# Patient Record
Sex: Male | Born: 1955 | Race: White | Hispanic: No | Marital: Married | Smoking: Current every day smoker
Health system: Southern US, Community
[De-identification: ages and names within clinical notes are randomized; demographics above are authoritative.]

## PROBLEM LIST (undated history)

## (undated) DIAGNOSIS — F329 Major depressive disorder, single episode, unspecified: Secondary | ICD-10-CM

## (undated) DIAGNOSIS — N400 Enlarged prostate without lower urinary tract symptoms: Secondary | ICD-10-CM

## (undated) DIAGNOSIS — I10 Essential (primary) hypertension: Secondary | ICD-10-CM

## (undated) DIAGNOSIS — Z87442 Personal history of urinary calculi: Secondary | ICD-10-CM

## (undated) DIAGNOSIS — I639 Cerebral infarction, unspecified: Secondary | ICD-10-CM

## (undated) DIAGNOSIS — F32A Depression, unspecified: Secondary | ICD-10-CM

## (undated) DIAGNOSIS — R7302 Impaired glucose tolerance (oral): Secondary | ICD-10-CM

## (undated) DIAGNOSIS — E785 Hyperlipidemia, unspecified: Secondary | ICD-10-CM

## (undated) DIAGNOSIS — R0602 Shortness of breath: Secondary | ICD-10-CM

## (undated) DIAGNOSIS — F419 Anxiety disorder, unspecified: Secondary | ICD-10-CM

## (undated) DIAGNOSIS — C61 Malignant neoplasm of prostate: Secondary | ICD-10-CM

## (undated) HISTORY — DX: Essential (primary) hypertension: I10

## (undated) HISTORY — DX: Major depressive disorder, single episode, unspecified: F32.9

## (undated) HISTORY — DX: Impaired glucose tolerance (oral): R73.02

## (undated) HISTORY — DX: Personal history of urinary calculi: Z87.442

## (undated) HISTORY — DX: Malignant neoplasm of prostate: C61

## (undated) HISTORY — DX: Depression, unspecified: F32.A

## (undated) HISTORY — DX: Benign prostatic hyperplasia without lower urinary tract symptoms: N40.0

## (undated) HISTORY — DX: Anxiety disorder, unspecified: F41.9

## (undated) HISTORY — DX: Cerebral infarction, unspecified: I63.9

## (undated) HISTORY — DX: Hyperlipidemia, unspecified: E78.5

---

## 1990-08-01 DIAGNOSIS — I10 Essential (primary) hypertension: Secondary | ICD-10-CM

## 1990-08-01 HISTORY — DX: Essential (primary) hypertension: I10

## 1999-02-06 ENCOUNTER — Encounter: Payer: Self-pay | Admitting: *Deleted

## 1999-02-06 ENCOUNTER — Emergency Department (HOSPITAL_COMMUNITY): Admission: EM | Admit: 1999-02-06 | Discharge: 1999-02-06 | Payer: Self-pay | Admitting: *Deleted

## 2002-09-30 HISTORY — PX: OTHER SURGICAL HISTORY: SHX169

## 2004-01-30 HISTORY — PX: LUNG BIOPSY: SHX232

## 2004-02-08 ENCOUNTER — Inpatient Hospital Stay (HOSPITAL_COMMUNITY): Admission: EM | Admit: 2004-02-08 | Discharge: 2004-02-09 | Payer: Self-pay | Admitting: Emergency Medicine

## 2004-02-09 ENCOUNTER — Encounter (INDEPENDENT_AMBULATORY_CARE_PROVIDER_SITE_OTHER): Payer: Self-pay | Admitting: Specialist

## 2004-02-19 ENCOUNTER — Encounter: Payer: Self-pay | Admitting: Internal Medicine

## 2004-02-23 ENCOUNTER — Ambulatory Visit (HOSPITAL_COMMUNITY): Admission: RE | Admit: 2004-02-23 | Discharge: 2004-02-23 | Payer: Self-pay | Admitting: Thoracic Surgery

## 2004-02-27 ENCOUNTER — Encounter (INDEPENDENT_AMBULATORY_CARE_PROVIDER_SITE_OTHER): Payer: Self-pay | Admitting: Specialist

## 2004-02-27 ENCOUNTER — Ambulatory Visit (HOSPITAL_COMMUNITY): Admission: RE | Admit: 2004-02-27 | Discharge: 2004-02-27 | Payer: Self-pay | Admitting: Thoracic Surgery

## 2004-03-09 ENCOUNTER — Encounter: Admission: RE | Admit: 2004-03-09 | Discharge: 2004-03-09 | Payer: Self-pay | Admitting: Thoracic Surgery

## 2004-09-05 IMAGING — CR DG CHEST 2V
2 series · 2 of 2 positions shown · non-contrast
Comparison: none

CLINICAL DATA: Lung lesion.  Prior biopsy.  Follow up. 
 CHEST X-RAY: 
 Two views of the chest are compared to a chest x-ray of 02/24/04 as well as a CT of the chest of 02/08/04.  The lesion noted in the right suprahilar region on CT of the chest is not well seen on today?s chest x-ray or the recent chest x-ray.  There may be slightly less opacity in the right suprahilar region on the frontal projection than on the chest x-ray of 02/24/04.  No discrete nodule is seen and no effusion is noted.  The heart is within normal limits in size.  There are degenerative changes in the shoulders.

[view not recorded (1 of 2)]
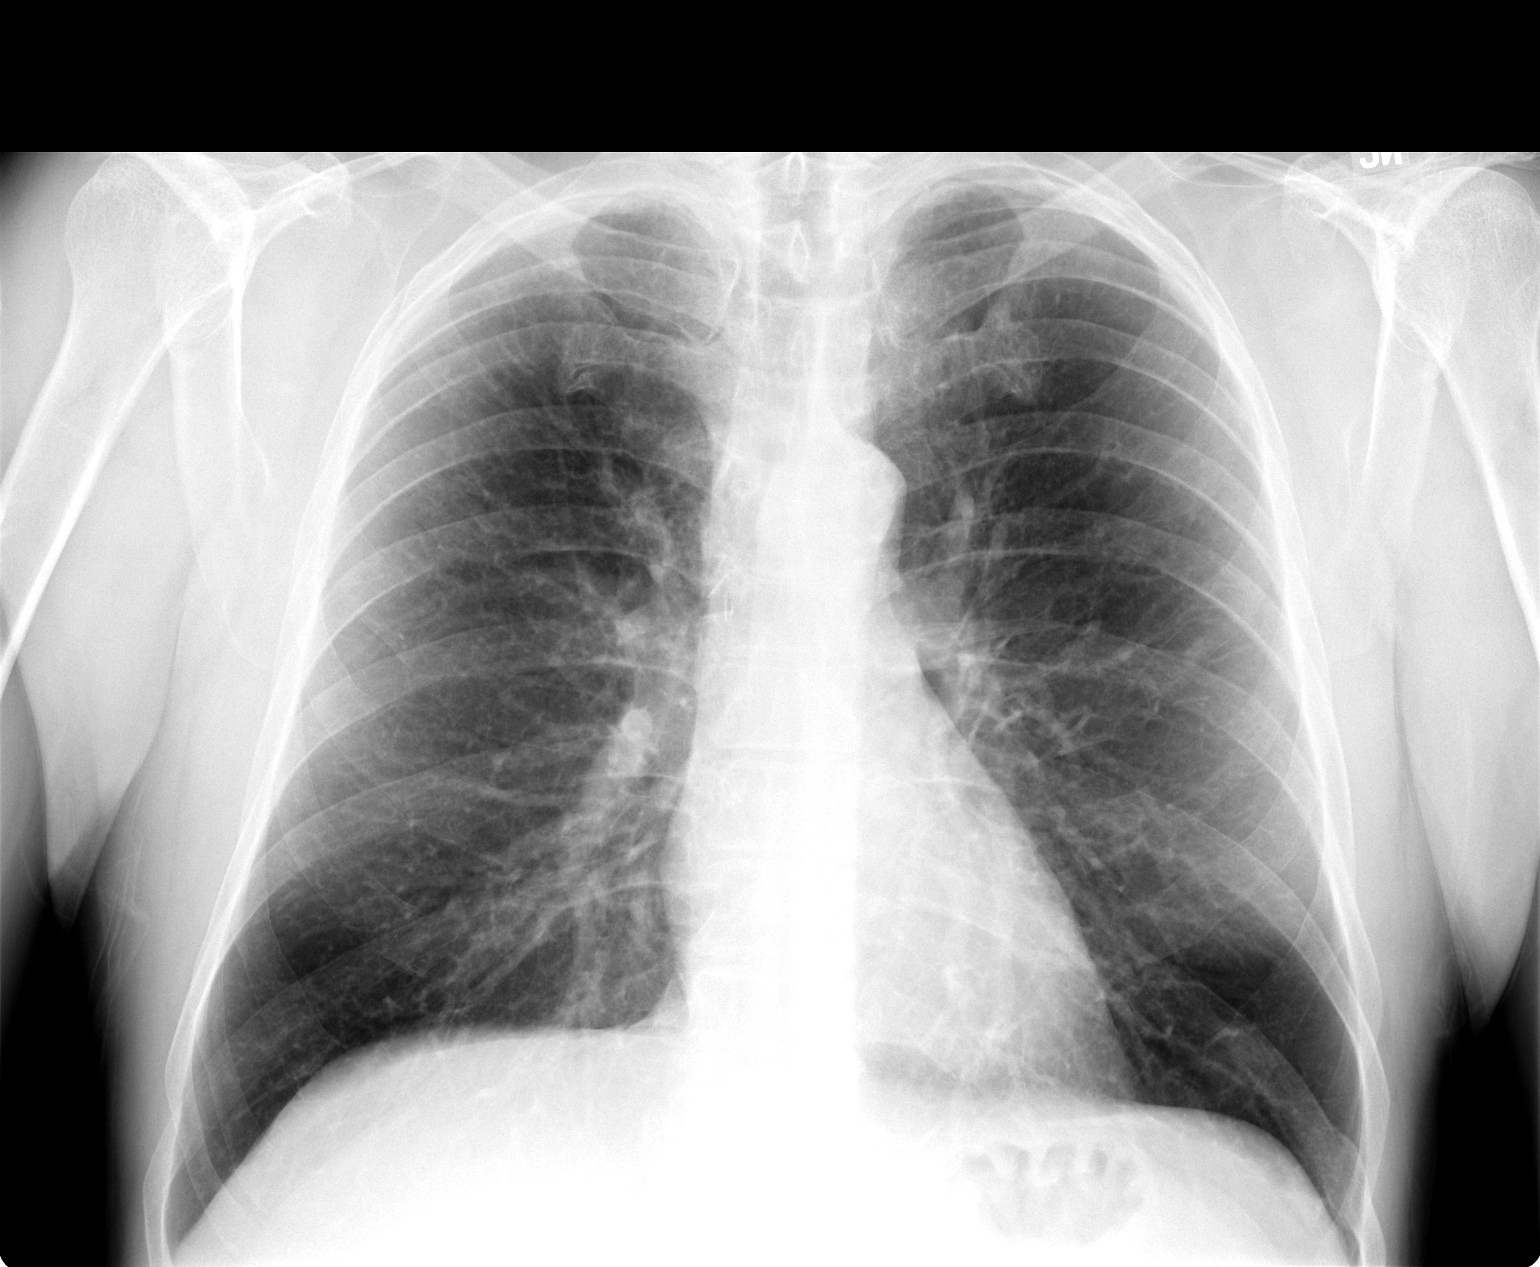

[view not recorded (2 of 2)]
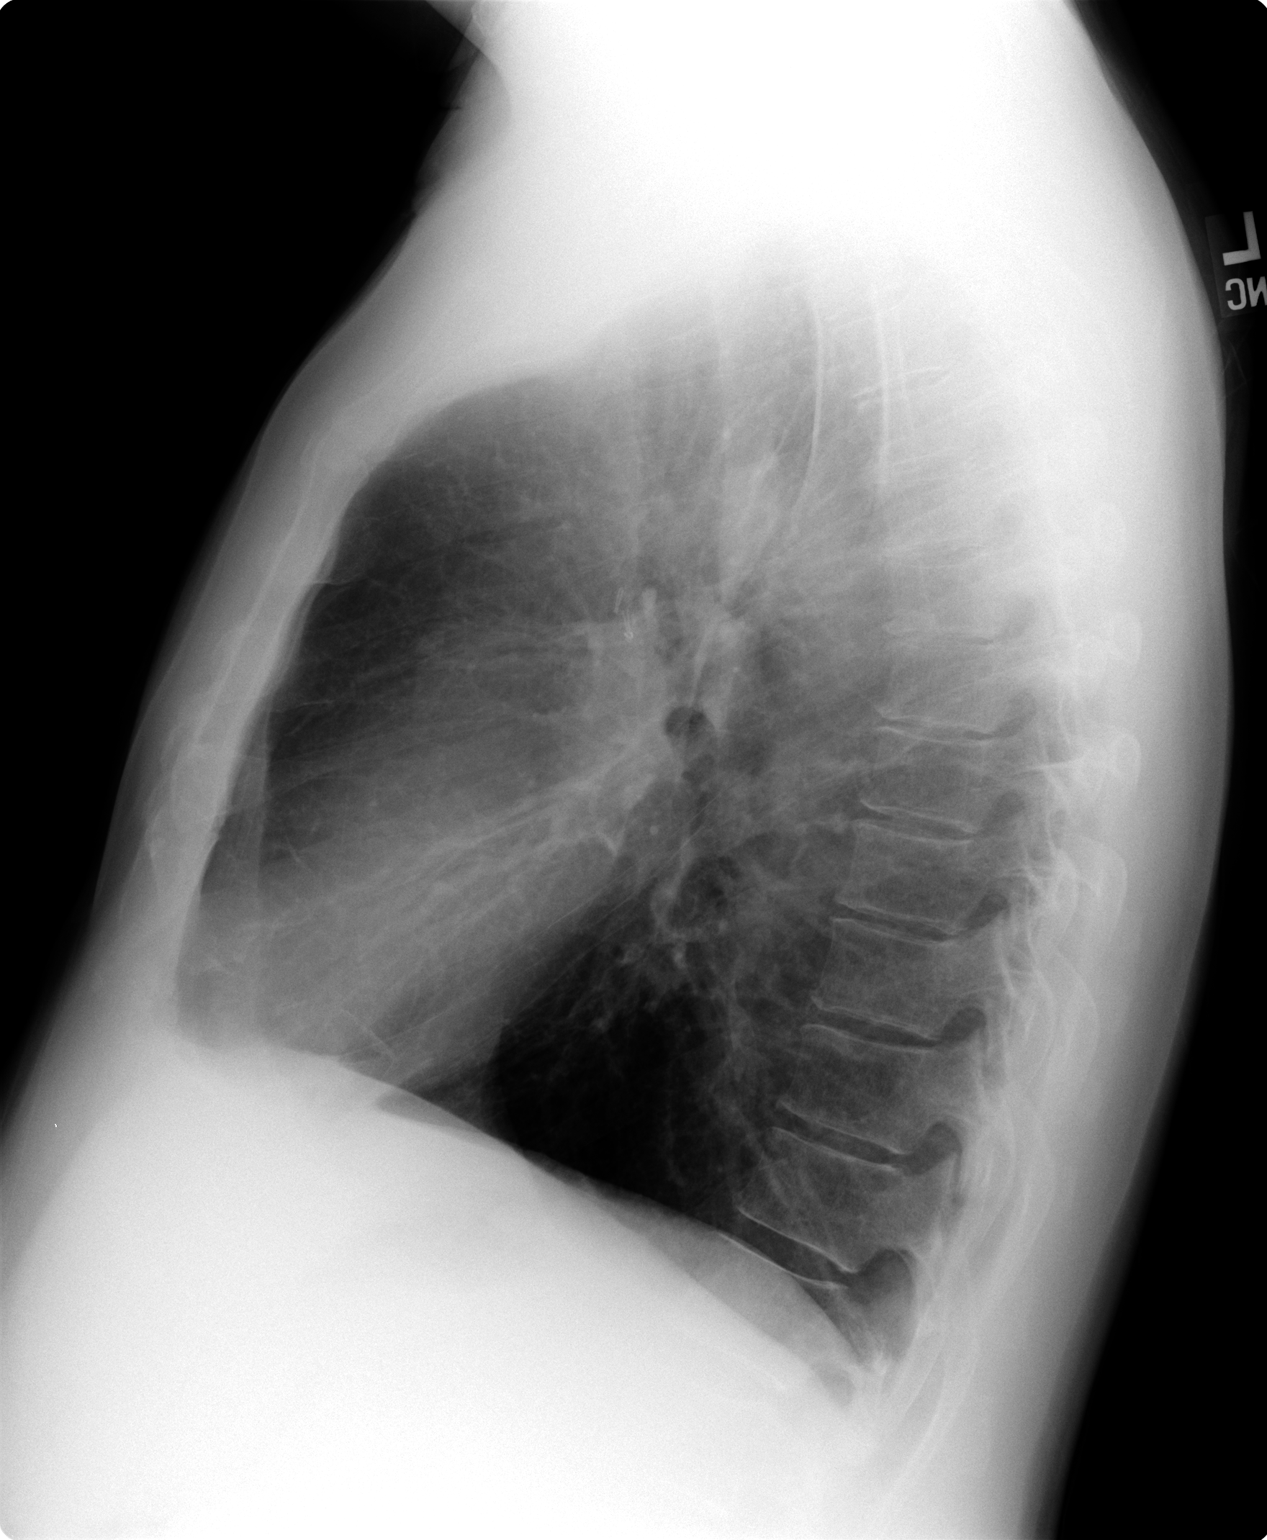

[2 of 2 positions shown; findings below may reference images not displayed]

IMPRESSION: There is perhaps slightly less opacity in the right suprahilar region.  No suspicious mass is noted and no effusion is noted.

## 2004-11-05 ENCOUNTER — Ambulatory Visit: Payer: Self-pay | Admitting: Internal Medicine

## 2004-11-18 ENCOUNTER — Ambulatory Visit: Payer: Self-pay | Admitting: Family Medicine

## 2004-12-03 ENCOUNTER — Encounter: Payer: Self-pay | Admitting: Internal Medicine

## 2005-07-19 ENCOUNTER — Ambulatory Visit: Payer: Self-pay | Admitting: Internal Medicine

## 2005-08-04 ENCOUNTER — Ambulatory Visit: Payer: Self-pay | Admitting: Internal Medicine

## 2005-08-09 ENCOUNTER — Ambulatory Visit: Payer: Self-pay | Admitting: Cardiology

## 2005-09-29 HISTORY — PX: RECTAL EXAMINATION UNDER ANESTHESIA W/ CYSTOSCOPY: SHX2307

## 2005-10-30 DIAGNOSIS — C61 Malignant neoplasm of prostate: Secondary | ICD-10-CM

## 2005-10-30 HISTORY — DX: Malignant neoplasm of prostate: C61

## 2005-11-15 ENCOUNTER — Ambulatory Visit: Payer: Self-pay | Admitting: Urology

## 2005-11-23 ENCOUNTER — Other Ambulatory Visit: Payer: Self-pay

## 2005-11-23 ENCOUNTER — Ambulatory Visit: Payer: Self-pay | Admitting: Urology

## 2005-12-14 ENCOUNTER — Ambulatory Visit: Payer: Self-pay | Admitting: Internal Medicine

## 2005-12-30 HISTORY — PX: OTHER SURGICAL HISTORY: SHX169

## 2006-06-20 ENCOUNTER — Ambulatory Visit: Payer: Self-pay | Admitting: Internal Medicine

## 2006-09-22 ENCOUNTER — Ambulatory Visit: Payer: Self-pay | Admitting: Internal Medicine

## 2006-09-22 LAB — CONVERTED CEMR LAB
Cholesterol: 192 mg/dL (ref 0–200)
Hgb A1c MFr Bld: 5.9 % (ref 4.6–6.0)
Microalb, Ur: 0.4 mg/dL (ref 0.0–1.9)
Triglycerides: 41 mg/dL (ref 0–149)

## 2006-12-18 ENCOUNTER — Telehealth (INDEPENDENT_AMBULATORY_CARE_PROVIDER_SITE_OTHER): Payer: Self-pay | Admitting: *Deleted

## 2007-01-12 DIAGNOSIS — N400 Enlarged prostate without lower urinary tract symptoms: Secondary | ICD-10-CM | POA: Insufficient documentation

## 2007-01-12 DIAGNOSIS — I10 Essential (primary) hypertension: Secondary | ICD-10-CM | POA: Insufficient documentation

## 2007-01-12 DIAGNOSIS — E785 Hyperlipidemia, unspecified: Secondary | ICD-10-CM

## 2007-02-22 ENCOUNTER — Encounter (INDEPENDENT_AMBULATORY_CARE_PROVIDER_SITE_OTHER): Payer: Self-pay | Admitting: *Deleted

## 2007-03-02 ENCOUNTER — Ambulatory Visit: Payer: Self-pay | Admitting: Internal Medicine

## 2007-03-12 LAB — CONVERTED CEMR LAB
Albumin: 4.1 g/dL (ref 3.5–5.2)
BUN: 7 mg/dL (ref 6–23)
Chloride: 100 meq/L (ref 96–112)
Potassium: 4.3 meq/L (ref 3.5–5.1)
Sodium: 138 meq/L (ref 135–145)

## 2007-03-14 ENCOUNTER — Encounter: Payer: Self-pay | Admitting: Internal Medicine

## 2007-06-12 ENCOUNTER — Telehealth: Payer: Self-pay | Admitting: Internal Medicine

## 2007-08-01 ENCOUNTER — Telehealth: Payer: Self-pay | Admitting: Family Medicine

## 2007-09-05 ENCOUNTER — Telehealth (INDEPENDENT_AMBULATORY_CARE_PROVIDER_SITE_OTHER): Payer: Self-pay | Admitting: *Deleted

## 2007-09-12 ENCOUNTER — Ambulatory Visit: Payer: Self-pay | Admitting: Internal Medicine

## 2007-09-12 DIAGNOSIS — F411 Generalized anxiety disorder: Secondary | ICD-10-CM | POA: Insufficient documentation

## 2007-09-13 LAB — CONVERTED CEMR LAB
Albumin: 3.9 g/dL (ref 3.5–5.2)
BUN: 6 mg/dL (ref 6–23)
CO2: 30 meq/L (ref 19–32)
Chloride: 101 meq/L (ref 96–112)
Cholesterol: 197 mg/dL (ref 0–200)
HDL: 102.8 mg/dL (ref >39.0)
Potassium: 4 meq/L (ref 3.5–5.1)
Sodium: 137 meq/L (ref 135–145)
Total CHOL/HDL Ratio: 1.9

## 2007-09-19 ENCOUNTER — Encounter: Payer: Self-pay | Admitting: Internal Medicine

## 2007-09-19 ENCOUNTER — Ambulatory Visit: Payer: Self-pay | Admitting: Urology

## 2007-12-17 ENCOUNTER — Encounter: Payer: Self-pay | Admitting: Internal Medicine

## 2007-12-19 ENCOUNTER — Telehealth (INDEPENDENT_AMBULATORY_CARE_PROVIDER_SITE_OTHER): Payer: Self-pay | Admitting: *Deleted

## 2008-03-11 ENCOUNTER — Ambulatory Visit: Payer: Self-pay | Admitting: Internal Medicine

## 2008-03-11 DIAGNOSIS — F341 Dysthymic disorder: Secondary | ICD-10-CM

## 2008-03-12 LAB — CONVERTED CEMR LAB
ALT: 46 units/L (ref 0–53)
AST: 36 units/L (ref 0–37)
Albumin: 4 g/dL (ref 3.5–5.2)
Alkaline Phosphatase: 48 units/L (ref 39–117)
Bilirubin, Direct: 0.1 mg/dL (ref 0.0–0.3)
CO2: 30 meq/L (ref 19–32)
Eosinophils Relative: 1.8 % (ref 0.0–5.0)
Glucose, Bld: 115 mg/dL — ABNORMAL HIGH (ref 70–99)
HDL: 88 mg/dL (ref >39.0)
Hemoglobin: 17.8 g/dL — ABNORMAL HIGH (ref 13.0–17.0)
LDL Cholesterol: 88 mg/dL (ref 0–99)
MCV: 94.7 fL (ref 78.0–100.0)
Neutro Abs: 3.2 10*3/uL (ref 1.4–7.7)
Phosphorus: 4.4 mg/dL (ref 2.3–4.6)
Potassium: 4.4 meq/L (ref 3.5–5.1)
RDW: 13.7 % (ref 11.5–14.6)
TSH: 2.16 microintl units/mL (ref 0.35–5.50)
Total Bilirubin: 0.8 mg/dL (ref 0.3–1.2)
Total Protein: 6.8 g/dL (ref 6.0–8.3)
Triglycerides: 40 mg/dL (ref 0–149)
WBC: 4.7 10*3/uL (ref 4.5–10.5)

## 2008-04-01 ENCOUNTER — Telehealth: Payer: Self-pay | Admitting: Internal Medicine

## 2008-04-22 ENCOUNTER — Ambulatory Visit: Payer: Self-pay | Admitting: Internal Medicine

## 2008-05-20 ENCOUNTER — Telehealth: Payer: Self-pay | Admitting: Internal Medicine

## 2008-07-08 ENCOUNTER — Encounter: Payer: Self-pay | Admitting: Internal Medicine

## 2008-08-25 ENCOUNTER — Telehealth: Payer: Self-pay | Admitting: Internal Medicine

## 2008-09-01 HISTORY — PX: OTHER SURGICAL HISTORY: SHX169

## 2008-09-16 ENCOUNTER — Telehealth: Payer: Self-pay | Admitting: Internal Medicine

## 2008-09-17 ENCOUNTER — Ambulatory Visit: Payer: Self-pay | Admitting: Internal Medicine

## 2008-09-17 DIAGNOSIS — R109 Unspecified abdominal pain: Secondary | ICD-10-CM

## 2008-09-18 ENCOUNTER — Encounter: Payer: Self-pay | Admitting: Internal Medicine

## 2008-09-26 ENCOUNTER — Telehealth: Payer: Self-pay | Admitting: Internal Medicine

## 2008-10-17 ENCOUNTER — Ambulatory Visit: Payer: Self-pay | Admitting: Internal Medicine

## 2008-10-17 DIAGNOSIS — R7301 Impaired fasting glucose: Secondary | ICD-10-CM

## 2008-10-20 ENCOUNTER — Encounter: Payer: Self-pay | Admitting: Internal Medicine

## 2008-10-20 LAB — CONVERTED CEMR LAB
AST: 19 units/L (ref 0–37)
Albumin: 4.1 g/dL (ref 3.5–5.2)
Alkaline Phosphatase: 49 units/L (ref 39–117)
Basophils Relative: 0.1 % (ref 0.0–3.0)
Bilirubin, Direct: 0 mg/dL (ref 0.0–0.3)
Cholesterol: 190 mg/dL (ref 0–200)
Eosinophils Relative: 1.6 % (ref 0.0–5.0)
HCT: 52 % (ref 39.0–52.0)
HDL: 91.6 mg/dL (ref >39.00)
LDL Cholesterol: 88 mg/dL (ref 0–99)
Lymphs Abs: 0.9 10*3/uL (ref 0.7–4.0)
Neutro Abs: 3.1 10*3/uL (ref 1.4–7.7)
Neutrophils Relative %: 69.5 % (ref 43.0–77.0)
Platelets: 178 10*3/uL (ref 150.0–400.0)
Potassium: 3.8 meq/L (ref 3.5–5.1)
RDW: 13.4 % (ref 11.5–14.6)
Sodium: 140 meq/L (ref 135–145)
Total Bilirubin: 0.9 mg/dL (ref 0.3–1.2)
Total Protein: 6.8 g/dL (ref 6.0–8.3)
VLDL: 10.8 mg/dL (ref 0.0–40.0)

## 2009-02-12 ENCOUNTER — Telehealth: Payer: Self-pay | Admitting: Internal Medicine

## 2009-04-10 ENCOUNTER — Telehealth: Payer: Self-pay | Admitting: Internal Medicine

## 2009-04-27 ENCOUNTER — Encounter: Payer: Self-pay | Admitting: Internal Medicine

## 2009-06-18 ENCOUNTER — Telehealth: Payer: Self-pay | Admitting: Internal Medicine

## 2009-07-01 DIAGNOSIS — I639 Cerebral infarction, unspecified: Secondary | ICD-10-CM

## 2009-07-01 HISTORY — DX: Cerebral infarction, unspecified: I63.9

## 2009-07-26 DIAGNOSIS — I69959 Hemiplegia and hemiparesis following unspecified cerebrovascular disease affecting unspecified side: Secondary | ICD-10-CM | POA: Insufficient documentation

## 2009-07-28 ENCOUNTER — Ambulatory Visit: Payer: Self-pay | Admitting: Family Medicine

## 2009-08-04 ENCOUNTER — Ambulatory Visit (HOSPITAL_COMMUNITY): Admission: RE | Admit: 2009-08-04 | Discharge: 2009-08-04 | Payer: Self-pay | Admitting: Diagnostic Neuroimaging

## 2009-08-04 ENCOUNTER — Encounter (INDEPENDENT_AMBULATORY_CARE_PROVIDER_SITE_OTHER): Payer: Self-pay | Admitting: Diagnostic Neuroimaging

## 2009-08-04 ENCOUNTER — Ambulatory Visit: Payer: Self-pay

## 2009-08-04 ENCOUNTER — Ambulatory Visit: Payer: Self-pay | Admitting: Cardiovascular Disease

## 2009-08-05 ENCOUNTER — Encounter: Payer: Self-pay | Admitting: Internal Medicine

## 2009-08-10 ENCOUNTER — Telehealth (INDEPENDENT_AMBULATORY_CARE_PROVIDER_SITE_OTHER): Payer: Self-pay | Admitting: *Deleted

## 2009-08-11 ENCOUNTER — Ambulatory Visit: Payer: Self-pay | Admitting: Internal Medicine

## 2009-08-14 ENCOUNTER — Telehealth: Payer: Self-pay | Admitting: Internal Medicine

## 2009-08-15 ENCOUNTER — Emergency Department (HOSPITAL_COMMUNITY): Admission: EM | Admit: 2009-08-15 | Discharge: 2009-08-15 | Payer: Self-pay | Admitting: Emergency Medicine

## 2009-08-17 ENCOUNTER — Ambulatory Visit: Payer: Self-pay | Admitting: Internal Medicine

## 2009-09-01 ENCOUNTER — Ambulatory Visit: Payer: Self-pay | Admitting: Internal Medicine

## 2009-09-03 LAB — CONVERTED CEMR LAB
Albumin: 4.3 g/dL (ref 3.5–5.2)
Basophils Absolute: 0 10*3/uL (ref 0.0–0.1)
Basophils Relative: 0.7 % (ref 0.0–3.0)
Bilirubin, Direct: 0 mg/dL (ref 0.0–0.3)
Calcium: 9.2 mg/dL (ref 8.4–10.5)
Chloride: 102 meq/L (ref 96–112)
GFR calc non Af Amer: 82.23 mL/min (ref >60)
HCT: 53.3 % — ABNORMAL HIGH (ref 39.0–52.0)
Hemoglobin: 17.9 g/dL — ABNORMAL HIGH (ref 13.0–17.0)
LDL Cholesterol: 55 mg/dL (ref 0–99)
Lymphocytes Relative: 21 % (ref 12.0–46.0)
Lymphs Abs: 1 10*3/uL (ref 0.7–4.0)
MCHC: 33.6 g/dL (ref 30.0–36.0)
Monocytes Absolute: 0.4 10*3/uL (ref 0.1–1.0)
Monocytes Relative: 7.4 % (ref 3.0–12.0)
Neutrophils Relative %: 69.7 % (ref 43.0–77.0)
Platelets: 194 10*3/uL (ref 150.0–400.0)
Potassium: 4 meq/L (ref 3.5–5.1)
RBC: 5.37 M/uL (ref 4.22–5.81)
RDW: 12.6 % (ref 11.5–14.6)
Sodium: 140 meq/L (ref 135–145)
TSH: 2.02 microintl units/mL (ref 0.35–5.50)
Total Bilirubin: 0.3 mg/dL (ref 0.3–1.2)
Triglycerides: 52 mg/dL (ref 0.0–149.0)
WBC: 5 10*3/uL (ref 4.5–10.5)

## 2009-10-13 ENCOUNTER — Ambulatory Visit: Payer: Self-pay | Admitting: Internal Medicine

## 2009-11-11 ENCOUNTER — Encounter: Payer: Self-pay | Admitting: Internal Medicine

## 2009-11-23 ENCOUNTER — Telehealth: Payer: Self-pay | Admitting: Internal Medicine

## 2009-12-01 ENCOUNTER — Ambulatory Visit: Payer: Self-pay | Admitting: Internal Medicine

## 2010-02-04 ENCOUNTER — Ambulatory Visit: Payer: Self-pay | Admitting: Internal Medicine

## 2010-04-07 ENCOUNTER — Ambulatory Visit: Payer: Self-pay | Admitting: Internal Medicine

## 2010-04-07 DIAGNOSIS — F329 Major depressive disorder, single episode, unspecified: Secondary | ICD-10-CM

## 2010-04-20 ENCOUNTER — Telehealth: Payer: Self-pay | Admitting: Internal Medicine

## 2010-04-20 ENCOUNTER — Ambulatory Visit: Payer: Self-pay | Admitting: Psychology

## 2010-04-27 ENCOUNTER — Ambulatory Visit: Payer: Self-pay | Admitting: Internal Medicine

## 2010-04-28 ENCOUNTER — Encounter: Payer: Self-pay | Admitting: Internal Medicine

## 2010-06-02 ENCOUNTER — Encounter: Payer: Self-pay | Admitting: Internal Medicine

## 2010-06-23 ENCOUNTER — Encounter: Payer: Self-pay | Admitting: Internal Medicine

## 2010-07-01 ENCOUNTER — Encounter: Payer: Self-pay | Admitting: Internal Medicine

## 2010-07-05 ENCOUNTER — Ambulatory Visit: Payer: Self-pay | Admitting: Urology

## 2010-07-07 ENCOUNTER — Ambulatory Visit: Payer: Self-pay | Admitting: Urology

## 2010-07-12 ENCOUNTER — Ambulatory Visit: Payer: Self-pay | Admitting: Internal Medicine

## 2010-07-12 DIAGNOSIS — L570 Actinic keratosis: Secondary | ICD-10-CM

## 2010-07-13 ENCOUNTER — Encounter: Payer: Self-pay | Admitting: Internal Medicine

## 2010-08-21 ENCOUNTER — Encounter: Payer: Self-pay | Admitting: Thoracic Surgery

## 2010-08-31 NOTE — Letter (Signed)
Summary: [facility]   Imported By: Lanelle Bal 07/02/2010 13:59:57  _____________________________________________________________________  External Attachment:    Type:   Image     Comment:   External Document  Appended Document: [facility] prostate biopsy done for increasing PSA

## 2010-08-31 NOTE — Assessment & Plan Note (Signed)
Summary: 1 MONTH FOLLOW-UP/DS   Vital Signs:  Patient profile:   55 year old male Weight:      153 pounds Temp:     98.5 degrees F oral Pulse rate:   80 / minute Pulse rhythm:   regular BP sitting:   128 / 70  (left arm) Cuff size:   large  Vitals Entered By: Mervin Hack CMA Duncan Dull) (September 01, 2009 11:08 AM) CC: follow-up visit   History of Present Illness: Has had a quiet month Saw neurologist this AM Not sure he had a second stroke--may have just been an accentuation of first Only mild disability No PT/OT seems to be needed Did have carotid narrowing on side opposite the stroke--no immediate attention needed  Has cut back on cigarettes about 1/2 of previous dose never tried patch Hospital doctor nicotine  Still slight numbness in V2 distribution on right (face) right hand feels slightly tingly as well Mild weakness in right hand and foot--coordination problems  Wife notes depression Not checking mail or answering machine at times Citalopram has seemed to help nerves  Allergies: 1)  Erythromycin Ethylsuccinate 2)  Viagra (Sildenafil Citrate)  Past History:  Past medical, surgical, family and social histories (including risk factors) reviewed for relevance to current acute and chronic problems.  Past Medical History: Reviewed history from 08/11/2009 and no changes required. Prostate cancer   10/2005---------Dr Cope  [phone] Impaired glucose tolerance Hyperlipidemia Hypertension  1992 Benign prostatic hypertrophy Kidney stone Anxiety CVA--12/10  Past Surgical History: Reviewed history from 09/17/2008 and no changes required. 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 Dad with CAD. Died of brain tumor @77  Mom with HTN,early DM, dementia Only child No cancer in  family  Social History: Reviewed history from 01/12/2007 and no changes required. 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   Neck:  supple, no masses, no thyromegaly, no carotid bruits, and no cervical lymphadenopathy.   Lungs:  normal respiratory effort and normal breath sounds.   Heart:  normal rate, regular rhythm, no murmur, and no gallop.   Extremities:  no edema Neurologic:  alert & oriented X3, strength normal in all extremities, and gait normal.   Psych:  normally interactive, good eye contact, dysphoric affect, and slightly anxious.     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 Take one by mouth two times a day His updated medication list for this problem includes:    Plavix 75 Mg Tabs (Clopidogrel bisulfate) .Marland Kitchen... 1 daily for stroke prevention    Aspirin Ec 325 Mg Tbec (Aspirin) .Marland Kitchen... 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 Take 1 tablet by mouth once a day    Losartan Potassium 100 Mg Tabs (Losartan potassium) .Marland Kitchen... 1 daily for high blood pressure  BP today: 128/70 Prior BP: 170/90  (08/17/2009)  Labs Reviewed: K+: 3.8 (10/17/2008) Creat: : 0.9 (10/17/2008)   Chol: 190 (10/17/2008)   HDL: 91.60 (10/17/2008)   LDL: 88 (10/17/2008)   TG: 54.0 (10/17/2008)  Orders: TLB-Renal Function Panel (80069-RENAL) TLB-CBC Platelet - w/Differential (85025-CBCD) TLB-TSH (Thyroid Stimulating Hormone) (84443-TSH)  Problem # 4:  HYPERLIPIDEMIA (ICD-272.4) Assessment: Unchanged  due for labs  His updated medication list for this problem includes:    Simvastatin 10 Mg Tabs (Simvastatin) .Marland Kitchen... 1 tab daily to lower cholesterol  Labs Reviewed: SGOT: 19 (10/17/2008)   SGPT: 22 (10/17/2008)   HDL:91.60 (10/17/2008), 88.0 (03/11/2008)  LDL:88 (10/17/2008), 88 (03/11/2008)  Chol:190 (10/17/2008), 184 (03/11/2008)  Trig:54.0 (10/17/2008), 40 (03/11/2008)  Orders: TLB-Lipid Panel (80061-LIPID) TLB-Hepatic/Liver Function Pnl (80076-HEPATIC) Venipuncture (04540)  Complete Medication List: 1)  Dilt-xr 180 Mg Cp24 (Diltiazem hcl) .... Take 1 tablet by mouth once a day 2)  Diazepam 5 Mg Tabs (Diazepam) .... Take 1 tablet by mouth twice a day 3)  Viagra 100 Mg Tabs (Sildenafil citrate) .Marland Kitchen.. 1 before sex as directed 4)  Flomax 0.4 Mg Xr24h-cap (Tamsulosin hcl) .Marland Kitchen.. 1 daily 5)  Potassium Citrate 10 Meq (1080 Mg) Cr-tabs (Potassium citrate) .... Take 1 by mouth once daily 6)  Simvastatin 10 Mg Tabs (Simvastatin) .Marland Kitchen.. 1 tab daily to lower cholesterol 7)  Losartan Potassium 100 Mg Tabs (Losartan potassium) .Marland Kitchen.. 1 daily for high blood pressure 8)  Citalopram Hydrobromide 20 Mg Tabs (Citalopram hydrobromide) .Marland Kitchen.. 1 tab daily for anxiety and mood 9)  Plavix 75 Mg Tabs (Clopidogrel bisulfate) .Marland Kitchen.. 1 daily for stroke prevention 10)  Aspirin Ec 325 Mg Tbec (Aspirin) .Marland Kitchen.. 1 daily  Patient Instructions: 1)  [facility] from [facility] 2)  Please schedule a follow-up appointment in 6-8  weeks.  3)  Please start nicotine patch, 21mg  daily, and stop smoking!!!! Prescriptions: PLAVIX 75 MG TABS  (CLOPIDOGREL BISULFATE) 1 daily for stroke prevention  #30 x 12   Entered and Authorized by:   Cindee Salt MD   Signed by:   Cindee Salt MD on 09/01/2009   Method used:   [facility] to        [facility] [phone]* (retail)       [address] [place], [place]  [address]       Ph: [phone] or [phone]       Fax: [phone]   RxID:   [id number] CITALOPRAM HYDROBROMIDE 20 MG TABS (CITALOPRAM HYDROBROMIDE) 1 tab daily for anxiety and mood  #30 x 12   Entered and Authorized by:   Cindee Salt MD   Signed by:   Cindee Salt MD on 09/01/2009   Method used:   Electronically to        [facility] [phone]* (retail)       [address] [place], [place]  [address]       Ph: [phone] or [phone]       Fax: [phone]   RxID:  [id number]   Current Allergies (reviewed today): ERYTHROMYCIN ETHYLSUCCINATE VIAGRA (SILDENAFIL CITRATE)

## 2010-08-31 NOTE — Assessment & Plan Note (Signed)
Summary: DISCUSS MEDICATION/CLE   Vital Signs:  Patient profile:   55 year old male Weight:      176 pounds O2 Sat:      95 % on Room air Temp:     98.3 degrees F oral Pulse rate:   82 / minute Pulse rhythm:   regular BP sitting:   122 / 70  (left arm) Cuff size:   large  Vitals Entered By: Mervin Hack CMA Duncan Dull) (April 07, 2010 12:40 PM)  O2 Flow:  Room air CC: discuss medications   History of Present Illness: DOing "terrible" wife unable to come today but she is concerned  He is very depressed wife feels "he needs to do something" reviewed trouble with antidepressant before Still not convinced that counselling will help "I need something to wake me up.. I don't want to do anything, and I don't care"  Has thought about dying worst times are getting up and going to bed Has considered suicide but no active desire "I just don't care"  Stopped drinking but that didn't help  Allergies: 1)  Erythromycin Ethylsuccinate 2)  Viagra (Sildenafil Citrate)  Past History:  Past medical, surgical, family and social histories (including risk factors) reviewed for relevance to current acute and chronic problems.  Past Medical History: Prostate cancer   10/2005---------Dr Cope  [phone] Impaired glucose tolerance Hyperlipidemia Hypertension  1992 Benign prostatic hypertrophy Kidney stone Anxiety CVA--12/10 Depression  Past Surgical History: Reviewed history from 09/17/2008 and no changes required. 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 Dad with CAD. Died of brain tumor @77  Mom with HTN,early DM, dementia Only child No cancer in family  Social History: Reviewed history from 01/12/2007 and no changes required. 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   Psych:  normally interactive, good eye contact, not anxious appearing, not suicidal at present, and dysphoric affect.     Impression & Recommendations:  Problem # 1:  DEPRESSION (ICD-311) Assessment Deteriorated  will try fluoxetine urged him to at least try counselling as well discussed urgent care if suicidal thoughts  His updated medication list for this problem includes:    Diazepam 5 Mg Tabs (Diazepam) .Marland Kitchen... Take 1 tablet by mouth twice a day    Fluoxetine Hcl 10 Mg Caps (Fluoxetine hcl) .Marland Kitchen... 1 tab daily for depression  Orders: Psychology Referral (Psychology)  Complete Medication List: 1)  Dilt-xr 180 Mg Cp24 (Diltiazem hcl) .... Take 1 tablet by mouth once a day 2)  Diazepam 5 Mg Tabs (Diazepam) .... Take 1 tablet by mouth twice a day 3)  Flomax 0.4 Mg Xr24h-cap (Tamsulosin hcl) .Marland Kitchen.. 1 daily 4)  Potassium Citrate 10 Meq (1080 Mg) Cr-tabs (Potassium citrate) .... Take 1 by mouth once daily 5)  Simvastatin 10 Mg Tabs (Simvastatin) .Marland Kitchen.. 1 tab daily to lower cholesterol 6)  Losartan Potassium 100 Mg Tabs (Losartan potassium) .Marland Kitchen.. 1 daily for high blood pressure 7)  Plavix 75 Mg Tabs (Clopidogrel bisulfate) .Marland Kitchen.. 1 daily for stroke prevention 8)  Aspirin Ec 325 Mg Tbec (Aspirin) .Marland Kitchen.. 1 daily 9)  Fluoxetine Hcl 10 Mg Caps (Fluoxetine hcl) .Marland Kitchen.. 1 tab daily for depression  Patient Instructions: 1)  Please start the fluoxetine and call if any signifcant problems 2)  Please schedule a follow-up appointment in 2-3 weeks.  3)  Referral Appointment Information 4)  Day/Date: 5)  Time: 6)  Place/MD: 7)  Address: 8)  Phone/Fax: 9)  Patient given appointment information. Information/Orders faxed/mailed. Prescriptions: FLUOXETINE HCL 10 MG CAPS (FLUOXETINE HCL) 1 tab daily for depression   #30 x 3   Entered and Authorized by:   Cindee Salt MD   Signed by:   Cindee Salt MD on 04/07/2010   Method used:   Electron[facility] to        [facility] [phone]* (retail)       [address] [place], [place]  [address]       Ph: [phone] or [phone]       Fax: [phone]   RxID:   [id number]   Current Allergies (reviewed today): ERYTHROMYCIN ETHYLSUCCINATE VIAGRA (SILDENAFIL CITRATE)

## 2010-08-31 NOTE — Progress Notes (Signed)
  Phone Note Other Incoming   Caller: Brittnay Reason for Call: Get patient information Action Taken: Information Sent Initial call taken by: Marijean Niemann Echo over to [facility] to fax [phone] [facility]  August 10, 2009 2:29 PM ]

## 2010-08-31 NOTE — Letter (Signed)
Summary: E-Mail from Dr.Perrin  E-Mail from Dr.Perrin   Imported By: Beau Fanny 04/28/2010 14:24:21  _____________________________________________________________________  External Attachment:    Type:   Image     Comment:   External Document

## 2010-08-31 NOTE — Assessment & Plan Note (Signed)
Summary: 1 M F/U DLO   Vital Signs:  Patient profile:   55 year old male Height:      68 inches Weight:      160.50 pounds BMI:     24.49 Temp:     98.1 degrees F oral Pulse rate:   80 / minute Pulse rhythm:   regular BP sitting:   130 / 80  (left arm) Cuff size:   regular  Vitals Entered By: Mervin Hack CMA Duncan Dull) (Dec 01, 2009 10:59 AM) CC: one month follow up   History of Present Illness: Went for OT evaluation He felt it would be a waste of his time  Still has the same right hand problems weakness and numb sensation notes trouble at hip also---can't get the hip flexed--like to get up into a truck  Mood is still not great still likes to sleep a lot lacks motivation has tried to come to grips with the spectre of life long disability (though mild) Discussed counselling---he would like to hold off  PSA went back down No further eval needed for now  Religion and faith are not important in his life  Allergies: 1)  Erythromycin Ethylsuccinate 2)  Viagra (Sildenafil Citrate)  Past History:  Past medical, surgical, family and social histories (including risk factors) reviewed for relevance to current acute and chronic problems.  Past Medical History: Reviewed history from 08/11/2009 and no changes required. Prostate cancer   10/2005---------Dr Cope  [phone] Impaired glucose tolerance Hyperlipidemia Hypertension  1992 Benign prostatic hypertrophy Kidney stone Anxiety CVA--12/10  Past Surgical History: Reviewed history from 09/17/2008 and no changes required. 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 Dad with CAD. Died of brain tumor @77  Mom with HTN,early DM, dementia Only child No cancer in family  Social History: Reviewed history from 01/12/2007 and no changes required. 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   Psych:  normally interactive, good eye contact, not anxious appearing, and dysphoric affect.  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 1 daily for stroke prevention    Aspirin Ec 325 Mg Tbec (Aspirin) .Marland Kitchen... 1 daily  Complete Medication List: 1)  Dilt-xr 180 Mg Cp24 (Diltiazem hcl) .... Take 1 tablet by mouth once a day 2)  Diazepam 5 Mg Tabs (Diazepam) .... Take 1 tablet by mouth twice a day 3)  Flomax 0.4 Mg Xr24h-cap (Tamsulosin hcl) .Marland Kitchen.. 1 daily 4)  Potassium Citrate 10 Meq (1080 Mg) Cr-tabs (Potassium citrate) .... Take 1 by mouth once daily 5)  Simvastatin 10 Mg Tabs (Simvastatin) .Marland Kitchen.. 1 tab daily to lower cholesterol 6)  Losartan Potassium 100 Mg Tabs (Losartan potassium) .Marland Kitchen.. 1 daily for high blood  pressure 7)  Plavix 75 Mg Tabs (Clopidogrel bisulfate) .Marland Kitchen.. 1 daily for stroke prevention 8)  Aspirin Ec 325 Mg Tbec (Aspirin) .Marland Kitchen.. 1 daily 9)  Citalopram Hydrobromide 40 Mg Tabs (Citalopram hydrobromide) .Marland Kitchen.. 1 tab daily for mood problems  Patient Instructions: 1)  Please schedule a follow-up appointment in 4 months .   Current Allergies (reviewed today): ERYTHROMYCIN ETHYLSUCCINATE VIAGRA (SILDENAFIL CITRATE)

## 2010-08-31 NOTE — Letter (Signed)
Summary: [facility]   Imported By: Lanelle Bal 06/10/2010 08:04:28  _____________________________________________________________________  External Attachment:    Type:   Image     Comment:   External Document  Appended Document: [facility] prostate cancer follow up PSA repeated--last was 1.0

## 2010-08-31 NOTE — Progress Notes (Signed)
Summary: Elevated BP  Phone Note From Other Clinic   Caller: Willa Frater occupational therapy at [facility] Call For: Dr. Alphonsus Sias Summary of Call: Marylene Land with Occupational therapy at [facility] has pt scheduled for occupational therapy evaluation. Pt 's BP is 162/98 and pt is asymptomatic, so the eval cannot be done now and Marylene Land wondered if pt needed to see Dr. Alphonsus Sias. Dr. Alphonsus Sias said to give pt appt next week. Pt scheduled appt for 08/18/09 at 12:30pm. Pt said he feels fine and if develops problem or if BP continues to rise pt will call back. (Pt has  BPmonitor at home.) Initial call taken by: Lewanda Rife LPN,  August 14, 2009 3:05 PM

## 2010-08-31 NOTE — Progress Notes (Signed)
Summary: diazepam  Phone Note Refill Request Message from:  Patient on April 20, 2010 2:33 PM  Refills Requested: Medication #1:  DIAZEPAM 5 MG TABS Take 1 tablet by mouth twice a day   Last Refilled: 01/18/2010 [facility] [address] Form [facility] on your desk.   Initial call taken by: Melody Comas,  April 20, 2010 2:34 PM  Follow-up for Phone Call        okay #60 x 1 Follow-up by: Cindee Salt MD,  April 21, 2010 7:53 AM  Additional Follow-up for Phone Call Additional follow up Details #1::        Rx faxed to pharmacy Additional Follow-up by: DeShannon Smith CMA Duncan Dull),  April 21, 2010 9:36 AM    Prescriptions: DIAZEPAM 5 MG TABS (DIAZEPAM) Take 1 tablet by mouth twice a day  #60 x 1   Entered by:   Mervin Hack CMA (AAMA)   Authorized by:   Cindee Salt MD   Signed by:   Mervin Hack CMA (AAMA) on 04/21/2010   Method used:   Handwritten   RxID:   [id number]

## 2010-08-31 NOTE — Assessment & Plan Note (Signed)
Summary: stroke day after christmas/rbh   Vital Signs:  Patient profile:   55 year old male Weight:      154 pounds BMI:     23.50 Temp:     98.4 degrees F oral Pulse rate:   76 / minute Pulse rhythm:   regular BP sitting:   160 / 80  (left arm) Cuff size:   large  Vitals Entered By: Mervin Hack CMA Duncan Dull) (August 11, 2009 11:09 AM)  Serial Vital Signs/Assessments:  Time      Position  BP       Pulse  Resp  Temp     By           R Arm     144/82                         Cindee Salt MD  CC: hospital follow-up   History of Present Illness: Had stroke 12/26 Awoke 5AM to go to bathroom and fell back into bed Noticed paralysis on his right side Took 2 aspirin--felt better over some time  Later that night, has spell of right sided weakness and aphasia took 2 aspirin again and went back to sleep Mostly better the next AM  Seen by [facility] Sent to [facility]  Had MRI which confirmed stroke Had carotid study which did not show critical stenosis Echo in chart--no thrombus  still with numbness in right arm Has some trouble coordinating his right foot Occ trouble speaking--trouble getting spoken and written thoughts out  Mood has been off doesn't feel right  Decreased energy levels --trouble getting himself going in the AM  Mole on right temple he is concerned about this Notes some pain there   Allergies: 1)  Erythromycin Ethylsuccinate 2)  Viagra (Sildenafil Citrate)  Past History:  Past medical, surgical, family and social histories (including risk factors) reviewed, and no changes noted (except as noted below).  Past Medical History: Prostate cancer   10/2005---------Dr Cope  [phone] Impaired glucose tolerance Hyperlipidemia Hypertension  1992 Benign prostatic hypertrophy Kidney stone Anxiety CVA--12/10  Past Surgical History: Reviewed history from 09/17/2008 and no changes required. ureterscopic stone  removal  09/2002 lung bx neg cancer  01/2004 cysto- bph only  09/2005 HIFU - prosate ca  12/2005 2/10 [facility]--Dr Achilles Dunk  Family History: Reviewed history from 03/11/2008 and no changes required. Dad with CAD. Died of brain tumor @77  Mom with HTN,early DM, dementia Only child No cancer in family  Social History: Reviewed history from 01/12/2007 and no changes required. 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 this has greatly reduced his anxiety level Only using the diazepam occ wieght is down a few pounds appetite okay  Physical Exam  General:  alert.  NAD Head:  normocephalic and atraumatic.   Eyes:  pupils equal, pupils round, pupils reactive to light, and no nystagmus.   Mouth:  no erythema and no exudates.   Neck:  supple, no masses, no thyromegaly, no carotid bruits, and no cervical lymphadenopathy.   Lungs:  normal respiratory effort and normal breath sounds.   Heart:  normal rate, regular rhythm, no murmur, and no gallop.   Msk:  no joint tenderness and no joint swelling.   Extremities:  no edema Neurologic:  alert & oriented X3, cranial nerves II-XII intact, gait normal, and Romberg negative.   Cannot do tandem gait Mild finger to nose dysmetria mild right leg weakness Skin:  new dark scaly lesion on right forehead (has appt already with Dr Jarold Motto) Psych:  normally interactive, good eye contact, and slightly anxious.     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 Take 1 by mouth once daily    Aspirin 325 Mg Tabs (Aspirin) .Marland Kitchen... Take 1 by mouth once daily  Problem # 2:  HYPERTENSION (ICD-401.9) Assessment: Deteriorated clearly has white coat component with sig decrease when I rechecked Nevertheless, we have goal of 130/85 or less given recent stroke will add losartan  His updated medication list for this problem includes:    Dilt-xr 180 Mg Cp24 (Diltiazem hcl) .Marland Kitchen... Take 1 tablet by mouth once a day    Losartan Potassium 50 Mg Tabs (Losartan potassium) .Marland Kitchen... 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 Take 1 tablet by mouth twice a day  Complete Medication List: 1)  Dilt-xr 180 Mg Cp24 (Diltiazem hcl) .... Take 1 tablet by mouth once a day 2)  Diazepam 5 Mg Tabs (Diazepam) .... Take 1 tablet by mouth twice a day 3)  Viagra 100 Mg Tabs (Sildenafil citrate) .Marland Kitchen.. 1 before sex as directed 4)  Flomax 0.4 Mg Xr24h-cap (Tamsulosin hcl) .Marland Kitchen.. 1 daily 5)  Potassium Citrate 10 Meq (1080 Mg) Cr-tabs (Potassium citrate) .... Take 1 by mouth once daily 6)  Plavix 75 Mg Tabs (Clopidogrel bisulfate) .... Take 1 by mouth once daily 7)  Aspirin 325 Mg Tabs (Aspirin) .... Take 1 by mouth once daily 8)  Losartan Potassium 50 Mg Tabs (Losartan potassium) .Marland Kitchen.. 1 tab daily for high blood pressure 9)  Simvastatin 10 Mg Tabs (Simvastatin)  .Marland Kitchen.. 1 tab daily to lower cholesterol  Patient Instructions: 1)  Please schedule a follow-up appointment in 3-4 weeks.  Prescriptions: SIMVASTATIN 10 MG TABS (SIMVASTATIN) 1 tab daily to lower cholesterol  #30 x 12   Entered and Authorized by:   Cindee Salt MD   Signed by:   Cindee Salt MD on 08/11/2009   Method used:   [facility] to        [facility] [phone]* (retail)       [address] [place], [place]  [address]       Ph: [phone] or [phone]  Fax: [phone]   RxID:   [id number] LOSARTAN POTASSIUM 50 MG TABS (LOSARTAN POTASSIUM) 1 tab daily for high blood pressure  #30 x 12   Entered and Authorized by:   Cindee Salt MD   Signed by:   Cindee Salt MD on 08/11/2009   Method used:   Electronically to        [facility] [phone]* (retail)       [address] [place], [place]  [address]       Ph: [phone] or [phone]       Fax: [phone]   RxID:   [id number]   Current Allergies (reviewed today): ERYTHROMYCIN ETHYLSUCCINATE VIAGRA (SILDENAFIL CITRATE)

## 2010-08-31 NOTE — Progress Notes (Signed)
Summary: Rx Diazepam  Phone Note Refill Request Call back at [phone] Message from:  [facility] on November 23, 2009 4:02 PM  Refills Requested: Medication #1:  DIAZEPAM 5 MG TABS Take 1 tablet by mouth twice a day   Last Refilled: 08/18/2009 Fax request, in your IN box.   Method Requested: Fax to Local Pharmacy Initial call taken by: Linde Gillis CMA Duncan Dull),  November 23, 2009 4:02 PM  Follow-up for Phone Call        okay #60 x 1 Follow-up by: Cindee Salt MD,  November 23, 2009 5:35 PM  Additional Follow-up for Phone Call Additional follow up Details #1::        Rx faxed to pharmacy Additional Follow-up by: DeShannon Smith CMA Duncan Dull),  November 24, 2009 8:38 AM    Prescriptions: DIAZEPAM 5 MG TABS (DIAZEPAM) Take 1 tablet by mouth twice a day  #60 x 1   Entered by:   Mervin Hack CMA (AAMA)   Authorized by:   Cindee Salt MD   Signed by:   Mervin Hack CMA (AAMA) on 11/24/2009   Method used:   Handwritten   RxID:   [id number]

## 2010-08-31 NOTE — Letter (Signed)
Summary: Records Dated 07-30-09 thru 09-01-09/[facility]  Records Dated 07-30-09 thru 09-01-09/[facility]   Imported By: Lanelle Bal 09/08/2009 11:44:05  _____________________________________________________________________  External Attachment:    Type:   Image     Comment:   External Document

## 2010-08-31 NOTE — Letter (Signed)
Summary: [facility]   [facility]   Imported By: Lanelle Bal 11/16/2009 13:25:28  _____________________________________________________________________  External Attachment:    Type:   Image     Comment:   External Document  Appended Document: [facility]  will need further testing and treatment if PSA doesn't stabilize or go down increased from 0.8 to 1.5 last time level drawn

## 2010-08-31 NOTE — Assessment & Plan Note (Signed)
Summary: 6-8WK FOLLOW UP / [facility]   Vital Signs:  Patient profile:   55 year old [name] [name] Weight:      152 pounds Temp:     97.9 degrees F oral Pulse rate:   80 / minute Pulse rhythm:   regular BP sitting:   140 / 80  (left arm) Cuff size:   large  Vitals Entered By: Mervin Hack CMA Duncan Dull) (October 13, 2009 11:45 AM) CC: 6-8 week follow-up   History of Present Illness: Feels about the same Still has trouble using his right hand Has some pain down lateral right leg and the leg doesn't work quite right still slight right facial tingling Trouble pulling hose for truck, picking up sticks around house 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 even before the stroke 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  Doesn't feel mood is any better Wife agrees Agricultural engineer to sleep early and sleeps a lot No improvement with warmer weather--even though the business calls are starting to come back in   Did go for 1 OT visit after stroke but they sent him home due to high blood pressure  still smoking  wife and family concerned about alcohol problem Drinks about 10 beers a day still has had a DWI generally doesn't get drunk never blacked out no amnesia  Allergies: 1)  Erythromycin Ethylsuccinate 2)  Viagra (Sildenafil Citrate)  Past History:  Past medical, surgical, family and social histories (including risk factors) reviewed for relevance to current acute and chronic problems.  Past Medical History: Reviewed history from 08/11/2009 and no changes required. Prostate cancer   10/2005---------Dr Cope  [phone] Impaired glucose tolerance Hyperlipidemia Hypertension  1992 Benign prostatic hypertrophy Kidney stone Anxiety CVA--12/10  Past Surgical History: Reviewed history from 09/17/2008 and no changes required. ureterscopic stone removal  09/2002 lung bx neg cancer  01/2004 cysto- bph only  09/2005 HIFU - prosate ca  12/2005 2/10 [facility]--Dr Achilles Dunk  Family  History: Reviewed history from 03/11/2008 and no changes required. Dad with CAD. Died of brain tumor @77  Mom with HTN,early DM, dementia Only child No cancer in family  Social History: Reviewed history from 01/12/2007 and no changes required. 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  NAD Psych:  normally interactive, good eye contact, not anxious appearing, and dysphoric affect.     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 1 daily for stroke prevention    Aspirin Ec 325 Mg Tbec (Aspirin) .Marland Kitchen... 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 Take 1 tablet by mouth once  a day    Losartan Potassium 100 Mg Tabs (Losartan potassium) .Marland Kitchen... 1 daily for high blood pressure  BP today: 140/80 Prior BP: 128/70 (09/01/2009)  Labs Reviewed: K+: 4.0 (09/01/2009) Creat: : 1.0 (09/01/2009)   Chol: 151 (09/01/2009)   HDL: 85.40 (09/01/2009)   LDL: 55 (09/01/2009)   TG: 52.0 (09/01/2009)  Complete Medication List: 1)  Dilt-xr 180 Mg Cp24 (Diltiazem hcl) .... Take 1 tablet by mouth once a  day 2)  Diazepam 5 Mg Tabs (Diazepam) .... Take 1 tablet by mouth twice a day 3)  Flomax 0.4 Mg Xr24h-cap (Tamsulosin hcl) .Marland Kitchen.. 1 daily 4)  Potassium Citrate 10 Meq (1080 Mg) Cr-tabs (Potassium citrate) .... Take 1 by mouth once daily 5)  Simvastatin 10 Mg Tabs (Simvastatin) .Marland Kitchen.. 1 tab daily to lower cholesterol 6)  Losartan Potassium 100 Mg Tabs (Losartan potassium) .Marland Kitchen.. 1 daily for high blood pressure 7)  Plavix [name] Mg Tabs (Clopidogrel bisulfate) .Marland Kitchen.. 1 daily for stroke prevention 8)  Aspirin Ec 325 Mg Tbec (Aspirin) .Marland Kitchen.. 1 daily 9)  Citalopram Hydrobromide 40 Mg Tabs (Citalopram hydrobromide) .Marland Kitchen.. 1 tab daily for mood problems  Patient Instructions: 1)  Please schedule a follow-up appointment in 1 month.  2)  Please cut down beer to no more than 3 a night 3)  Referral Appointment Information 4)  Day/Date: 5)  Time: 6)  Place/MD: 7)  Address: 8)  Phone/Fax: 9)  Patient given appointment information. Information/Orders faxed/mailed.  Prescriptions: CITALOPRAM HYDROBROMIDE 40 MG TABS (CITALOPRAM HYDROBROMIDE) 1 tab daily for mood problems  #30 x 11   Entered and Authorized by:   Cindee Salt MD   Signed by:   Cindee Salt MD on 10/13/2009   Method used:   Electron[facility] to        [facility] [phone]* (retail)       [address] [place], [place]  [address]       Ph: [phone] or [phone]       Fax: [phone]   RxID:   [id number]   Current Allergies (reviewed today): ERYTHROMYCIN ETHYLSUCCINATE VIAGRA (SILDENAFIL CITRATE)

## 2010-08-31 NOTE — Assessment & Plan Note (Signed)
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  Past Medical History: Reviewed history from 04/07/2010 and no changes required. Prostate cancer   10/2005---------Dr Cope  [phone] Impaired glucose tolerance Hyperlipidemia Hypertension  1992 Benign prostatic hypertrophy Kidney stone Anxiety CVA--12/10 Depression  Past Surgical History: Reviewed history from 09/17/2008 and no changes required. 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 Dad with CAD. Died of brain tumor @77  Mom with HTN,early DM, dementia Only child No cancer in family  Social History: Reviewed history from 01/12/2007 and no changes required. 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         weight is stable   Physical Exam  General:  alert and normal appearance.   Psych:  normally interactive, good eye contact, not anxious appearing, and dysphoric affect.     Impression & Recommendations:  Problem # 1:  DEPRESSION (ICD-311) Assessment Unchanged no sig effect with the fluoxetine but no side effects didn't find the counselling helpful  will try increasing the fluoxetine  The following medications were removed from the medication list:    Fluoxetine Hcl 10 Mg Caps (Fluoxetine hcl) .Marland Kitchen... 1 tab daily for depression His updated medication list for this problem includes:    Diazepam 5 Mg Tabs (Diazepam) .Marland Kitchen... Take 1 tablet by mouth twice a day    Fluoxetine Hcl 20 Mg Caps (Fluoxetine hcl) .Marland Kitchen... 1 tab daily to help mood  Complete Medication List: 1)  Dilt-xr 180 Mg Cp24 (Diltiazem hcl) .... Take 1 tablet by mouth once a day 2)  Diazepam 5 Mg Tabs (Diazepam) .... Take 1 tablet by mouth twice a day 3)  Flomax 0.4 Mg Xr24h-cap (Tamsulosin hcl) .Marland Kitchen.. 1 daily 4)  Simvastatin 10 Mg Tabs (Simvastatin) .Marland Kitchen.. 1 tab daily to lower cholesterol 5)  Losartan Potassium 100 Mg Tabs (Losartan potassium) .Marland Kitchen.. 1 daily for high blood pressure 6)  Plavix 75 Mg Tabs (Clopidogrel bisulfate) .Marland Kitchen.. 1 daily for stroke prevention 7)  Aspirin Ec 325 Mg Tbec (Aspirin) .Marland KitchenMarland KitchenMarland Kitchen 1  daily 8)  Potassium Citrate 10 Meq (1080 Mg) Cr-tabs (Potassium citrate) .... Take 1 by mouth once daily 9)  Fluoxetine Hcl 20 Mg Caps (Fluoxetine hcl) .Marland Kitchen.. 1 tab daily to help mood  Patient Instructions: 1)  Please increase the fluoxetine to 20mg  daily--can take 2 of the 10mg  capsules till they are gone 2)  Please schedule a follow-up appointment in 2 months.  Prescriptions: FLUOXETINE HCL 20 MG CAPS (FLUOXETINE HCL) 1 tab daily to help mood  #30 x  11   Entered and Authorized by:   Cindee Salt MD   Signed by:   Cindee Salt MD on 04/27/2010   Method used:   Electronically to        [facility] [phone]* (retail)       [address] [place], [place]  [address]       Ph: [phone] or [phone]       Fax: [phone]   RxID:   [id number]   Current Allergies (reviewed today): ERYTHROMYCIN ETHYLSUCCINATE VIAGRA (SILDENAFIL CITRATE)  Appended Document: ROA FOR FOLLOW-UP/JRR    Clinical Lists Changes  Orders: Added new Service order of Flu Vaccine 81yrs + [phone]) - Signed Added new Service order of Admin 1st Vaccine ([id number]) - Signed Added new Service order of Admin 1st Vaccine [facility]) [phone]) - Signed Observations: Added new observation of FLU VAX#1VIS: 02/23/10 version given April 27, 2010. (04/27/2010 10:46) Added new observation of FLU VAXLOT: [id number] (04/27/2010 10:46) Added new observation of FLU VAX EXP: 01/29/2011 (04/27/2010 10:46) Added new observation of FLU VAXBY: DeShannon Smith CMA (AAMA) (04/27/2010 10:46) Added new observation of FLU VAXRTE: IM (04/27/2010 10:46) Added new observation of FLU VAX DSE: 0.5 ml (04/27/2010 10:46) Added new observation of FLU VAXMFR: GlaxoSmithKline (04/27/2010 10:46) Added new observation of FLU VAX SITE: left deltoid (04/27/2010 10:46) Added new observation of FLU VAX: Fluvax 3+ (04/27/2010 10:46)       Influenza Vaccine    Vaccine Type: Fluvax 3+    Site: left deltoid    Mfr: GlaxoSmithKline    Dose: 0.5 ml    Route: IM    Given by: Mervin Hack CMA (AAMA)    Exp. Date: 01/29/2011    Lot #: [id number]    VIS given: 02/23/10 version given April 27, 2010.  Flu Vaccine Consent Questions    Do you have a history of severe allergic reactions to this vaccine? no    Any prior history of allergic reactions to egg and/or gelatin? no    Do you have a sensitivity to the preservative Thimersol? no    Do you have a  past history of Guillan-Barre Syndrome? no    Do you currently have an acute febrile illness? no    Have you ever had a severe reaction to latex? no    Vaccine information given and explained to patient? yes

## 2010-08-31 NOTE — Assessment & Plan Note (Signed)
Summary: ELEVATED BP ADD PER DR Kamron Vanwyhe/[place]   Vital Signs:  Patient profile:   55 year old male Weight:      153 pounds Temp:     98.4 degrees F oral Pulse rate:   84 / minute Pulse rhythm:   regular BP sitting:   170 / 90  (left arm) Cuff size:   large  Vitals Entered By: Sydell Axon LPN (August 17, 2009 5:05 PM)  Serial Vital Signs/Assessments:  Time      Position  BP       Pulse  Resp  Temp     By           R Arm     162/92                         Cindee Salt MD  CC: Elevated BP, went to [facility] ER Saturday night, had another stroke, BP on patient's machine was 169/106   History of Present Illness: Here with wife now  Had another stroke 2 days ago---5AM--right hand and face paralyzed Had loose stools then also 911 called ER evaluation including MRI Did see a new stroke on the MRI  Called Dr Danie Binder neuro Stopped the plavix and changed to aggrenox BP was up some then  No real effect on the foot Now his hand feels numb Inside of mouth was numb--now only around the outside Speech is slightly different per wife  checking BP regularly "bouncing around a lot"  Allergies: 1)  Erythromycin Ethylsuccinate 2)  Viagra (Sildenafil Citrate)  Past History:  Past medical, surgical, family and social histories (including risk factors) reviewed for relevance to current acute and chronic problems.  Past Medical History: Reviewed history from 08/11/2009 and no changes required. Prostate cancer   10/2005---------Dr Cope  [phone] Impaired glucose tolerance Hyperlipidemia Hypertension  1992 Benign prostatic hypertrophy Kidney stone Anxiety CVA--12/10  Past Surgical History: Reviewed history from 09/17/2008 and no changes required. 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 Dad with CAD.  Died of brain tumor @77  Mom with HTN,early DM, dementia Only child No cancer in family  Social History: Reviewed history from 01/12/2007 and no changes required. 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  NAD Head:  face symmetric Neck:  supple, no masses, and no thyromegaly.   Lungs:  normal respiratory effort and normal breath sounds.   Heart:  normal rate, regular rhythm, no murmur, and no gallop.   Neurologic:  no weakness on right leg or arm but has problems of praxis (can't right properly) sensation of heat there Psych:  normally interactive, good eye contact, and moderately anxious.     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 Take 1 by mouth once daily    Aspirin 325 Mg Tabs (Aspirin) .Marland Kitchen... Take 1 by mouth once daily His updated medication list for this problem includes:    Aggrenox 25-200 Mg Xr12h-cap (Aspirin-dipyridamole) .Marland Kitchen... Take one by mouth two times a day  Problem # 2:  ANXIETY (ICD-300.00) Assessment: Deteriorated mood is worse will try citalopram  His updated medication list for this problem includes:    Diazepam 5 Mg Tabs (Diazepam) .Marland Kitchen... Take 1 tablet by mouth twice a day    Citalopram Hydrobromide 10 Mg Tabs (Citalopram hydrobromide) .Marland Kitchen... 1 tab daily for anxiety  Problem # 3:  HYPERTENSION  (ICD-401.9) Assessment: Comment Only will increase the losartan  The following medications were removed from the medication list:    Losartan Potassium 50 Mg Tabs (Losartan potassium) .Marland Kitchen... 1 tab daily for high blood pressure His updated medication list for this problem includes:    Dilt-xr 180 Mg Cp24 (Diltiazem hcl) .Marland Kitchen... Take 1 tablet by mouth once a day    Losartan Potassium 100 Mg Tabs (Losartan potassium) .Marland Kitchen... 1 daily for high blood pressure  BP today: 170/90 Prior BP: 160/80 (08/11/2009)  Labs Reviewed: K+: 3.8 (10/17/2008) Creat: : 0.9 (10/17/2008)   Chol: 190 (10/17/2008)   HDL: 91.60 (10/17/2008)   LDL: 88 (10/17/2008)   TG: 54.0 (10/17/2008)  Complete Medication List: 1)  Dilt-xr 180 Mg Cp24 (Diltiazem hcl) .... Take 1 tablet by mouth once a day 2)  Diazepam 5 Mg Tabs (Diazepam) .... Take 1 tablet by mouth twice a day 3)  Viagra 100 Mg Tabs (Sildenafil citrate) .Marland Kitchen.. 1 before sex as directed 4)  Flomax 0.4 Mg Xr24h-cap (Tamsulosin hcl) .Marland Kitchen.. 1 daily 5)  Potassium Citrate 10 Meq (1080 Mg) Cr-tabs (Potassium citrate) .... Take 1 by mouth once daily 6)  Simvastatin 10 Mg Tabs (Simvastatin) .Marland Kitchen.. 1 tab daily to lower cholesterol 7)  Aggrenox 25-200 Mg Xr12h-cap (Aspirin-dipyridamole) .... Take one by mouth two times a day 8)  Losartan Potassium 100 Mg Tabs (Losartan potassium) .Marland Kitchen.. 1 daily for high blood pressure 9)  Citalopram Hydrobromide 10 Mg Tabs (Citalopram hydrobromide) .Marland Kitchen.. 1 tab daily for anxiety  Patient Instructions: 1)  Please increase the losartan to 100mg . You can take 2 of the 50mg  tabs till they are gone and new prescription sent 2)  Please start the citalopram for your anxiety 3)  Please stop smoking--use a 21mg  nicotine patch daily 4)  No more than 3 beers per day 5)  Please schedule a follow-up appointment in 2 weeks.  Prescriptions: CITALOPRAM HYDROBROMIDE 10 MG TABS (CITALOPRAM HYDROBROMIDE) 1 tab daily for anxiety  #30 x 12   Entered and Authorized by:    Cindee Salt MD   Signed by:   Cindee Salt MD on 08/17/2009   Method used:   Electronically to        [facility] [phone]* (retail)       [address] [place], [place]  [address]       Ph: [phone] or [phone]       Fax: [phone]   RxID:   [id number] POTASSIUM 100 MG TABS (LOSARTAN POTASSIUM) 1 daily for high blood pressure  #30 x 12   Entered and Authorized by:   Cindee Salt MD   Signed by:   Cindee Salt MD on 08/17/2009  Method used:   Electronically to        [facility] [phone]* (retail)       [address] [place], [place]  [address]       Ph: [phone] or [phone]       Fax: [phone]   RxID:   [id number]   Current Allergies (reviewed today): ERYTHROMYCIN ETHYLSUCCINATE VIAGRA (SILDENAFIL CITRATE)

## 2010-08-31 NOTE — Assessment & Plan Note (Signed)
Summary: discuss medications/alc   Vital Signs:  Patient profile:   55 year old male Height:      68 inches Weight:      173.38 pounds BMI:     26.46 Temp:     98.4 degrees F oral Pulse rate:   84 / minute Pulse rhythm:   regular BP sitting:   120 / 70  (right arm) Cuff size:   regular  Vitals Entered By: Linde Gillis CMA Duncan Dull) (February 04, 2010 2:46 PM) CC: discuss medications   History of Present Illness: Here with wife he feels his wife wanted him to come in but she notes that he had concerns to be discussed  "I am disabled.Marland KitchenMarland KitchenI can't work like I used toWalt Disney, can't carry heavy weight, etc 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  Past Medical History: Reviewed history from 08/11/2009 and no changes required. Prostate cancer   10/2005---------Dr Cope  [phone] Impaired glucose tolerance Hyperlipidemia Hypertension  1992 Benign prostatic hypertrophy Kidney stone Anxiety CVA--12/10  Past Surgical History: Reviewed history from 09/17/2008 and no changes required. 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 Dad with CAD. Died of brain tumor @77  Mom with HTN,early DM, dementia Only child No cancer in family  Social History: Reviewed  history from 01/12/2007 and no changes required. 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   Psych:  normally interactive, good eye contact, not anxious appearing, and not depressed appearing.     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 1 daily for stroke prevention    Aspirin Ec 325 Mg Tbec (Aspirin) .Marland Kitchen... 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 Take 1 tablet by mouth once a day 2)  Diazepam 5 Mg Tabs (Diazepam) .... Take 1 tablet by mouth twice a day 3)  Flomax 0.4 Mg Xr24h-cap (Tamsulosin hcl) .Marland Kitchen.. 1 daily 4)  Potassium Citrate 10 Meq (1080 Mg) Cr-tabs (Potassium citrate) .... Take 1 by mouth  once daily 5)  Simvastatin 10 Mg Tabs (Simvastatin) .Marland Kitchen.. 1 tab daily to lower cholesterol 6)  Losartan Potassium 100 Mg Tabs (Losartan potassium) .Marland Kitchen.. 1 daily for high blood pressure 7)  Plavix 75 Mg Tabs (Clopidogrel bisulfate) .Marland Kitchen.. 1 daily for stroke prevention 8)  Aspirin Ec 325 Mg Tbec (Aspirin) .Marland Kitchen.. 1 daily  Patient Instructions: 1)  Please schedule a follow-up appointment in 4 months .  2)  Cancel August appt  Current Allergies (reviewed today): ERYTHROMYCIN  ETHYLSUCCINATE VIAGRA (SILDENAFIL CITRATE)

## 2010-09-02 NOTE — Assessment & Plan Note (Signed)
Summary: ROA FOR 2 MONTH FOLLOW-UP/JRR   Vital Signs:  Patient profile:   55 year old male Weight:      173 pounds Pulse rate:   76 / minute Pulse rhythm:   regular BP sitting:   122 / 68  (left arm) Cuff size:   large  Vitals Entered By: Mervin Hack CMA Duncan Dull) (July 12, 2010 10:27 AM) CC: 2 month follow-up   History of Present Illness: Had repeat biopsy of his prostate which was positive on the opposite side Had pelvic MRI and bone scan Disappointed that the HIFU didn't get rid of it completely  Discussed RT, seeds or hormonal Rx. (or freezing) Has appt tomorrow to discuss with Dr Achilles Dunk  Mood problems are about the same "I can't even tell I am taking the pills"  still has balance problems---can't carry things down from attic "I can't do anything" No change in right hand apraxia has to sit after being up for a while--can't walk for extended periods  abnormal spot on right temple has returned after derm treatment some months ago  Allergies: 1)  Erythromycin Ethylsuccinate 2)  Viagra (Sildenafil Citrate)  Past History:  Past medical, surgical, family and social histories (including risk factors) reviewed for relevance to current acute and chronic problems.  Past Medical History: Reviewed history from 04/07/2010 and no changes required. Prostate cancer   10/2005---------Dr Cope  [phone] Impaired glucose tolerance Hyperlipidemia Hypertension  1992 Benign prostatic hypertrophy Kidney stone Anxiety CVA--12/10 Depression  Past Surgical History: Reviewed history from 09/17/2008 and no changes required. 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 Dad with CAD. Died of brain tumor @77  Mom with HTN,early DM, dementia Only child No cancer in family  Social History: Reviewed history from  01/12/2007 and no changes required. 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   Skin:  8-47mm diameter actinic at right temple Psych:  normally interactive, good eye contact, not anxious appearing, and dysphoric affect.     Impression & Recommendations:  Problem # 1:  DEPRESSION (ICD-311) Assessment Unchanged no better on meds but is stable new stress with recurrence of prostate cancer will continue meds  His updated medication list for this problem includes:    Diazepam 5 Mg Tabs (Diazepam) .Marland Kitchen... Take 1 tablet by mouth twice a day    Fluoxetine Hcl 20 Mg Caps (Fluoxetine hcl) .Marland Kitchen... 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 1 daily for stroke prevention    Aspirin Ec 325 Mg Tbec (Aspirin) .Marland Kitchen... 1 daily  Problem # 3:  ADENOCARCINOMA, PROSTATE (ICD-185) Assessment: Deteriorated recurrent seeing Dr Achilles Dunk about recurrence  Problem # 4:  ACTINIC KERATOSIS (ICD-702.0) Assessment: Deteriorated  recurrent at right temple  Liquid nitrogen 60 seconds x 2 tolerated well discussed home care  Orders: Cryotherapy/Destruction benign or premalignant lesion (1st lesion)  (17000)  Complete Medication List: 1)  Dilt-xr 180 Mg Cp24 (Diltiazem hcl) .... Take 1 tablet by mouth once a day 2)  Diazepam 5 Mg Tabs (Diazepam) .... Take 1 tablet by mouth twice a day 3)  Flomax 0.4 Mg Xr24h-cap (Tamsulosin hcl) .Marland Kitchen.. 1 daily 4)  Simvastatin 10 Mg Tabs (Simvastatin) .Marland Kitchen.. 1 tab daily to lower cholesterol 5)  Losartan Potassium 100 Mg Tabs  (Losartan potassium) .Marland Kitchen.. 1 daily for high blood pressure 6)  Plavix 75 Mg Tabs (Clopidogrel bisulfate) .Marland Kitchen.. 1 daily for stroke prevention 7)  Aspirin Ec 325 Mg Tbec (Aspirin) .Marland Kitchen.. 1 daily 8)  Potassium Citrate 10 Meq (1080 Mg) Cr-tabs (Potassium citrate) .... Take 1 by mouth once daily 9)  Fluoxetine Hcl 20 Mg Caps (Fluoxetine hcl) .Marland Kitchen.. 1 tab daily to help mood  Patient Instructions: 1)  Please schedule a follow-up appointment in 4 months .    Orders Added: 1)  Est. Patient Level III [16109] 2)  Cryotherapy/Destruction benign or premalignant lesion (1st lesion)  [17000]    Current Allergies (reviewed today): ERYTHROMYCIN ETHYLSUCCINATE VIAGRA (SILDENAFIL CITRATE)

## 2010-09-02 NOTE — Letter (Signed)
Summary: [facility]   Imported By: Lanelle Bal 07/19/2010 12:09:19  _____________________________________________________________________  External Attachment:    Type:   Image     Comment:   External Document  Appended Document: [facility] new prostate cancer on other side discussed options but recommended external beam RT

## 2010-09-02 NOTE — Letter (Signed)
Summary: [facility]   Imported By: Maryln Gottron 07/12/2010 09:24:47  _____________________________________________________________________  External Attachment:    Type:   Image     Comment:   External Document  Appended Document: [facility] Gleason 6 adenocarcinoma has recurred (contralateral side though)MRI, bone scan ordered

## 2010-09-15 ENCOUNTER — Encounter: Payer: Self-pay | Admitting: *Deleted

## 2010-09-15 ENCOUNTER — Ambulatory Visit: Payer: Self-pay | Admitting: Radiation Oncology

## 2010-09-15 DIAGNOSIS — R7302 Impaired glucose tolerance (oral): Secondary | ICD-10-CM

## 2010-09-15 DIAGNOSIS — F32A Depression, unspecified: Secondary | ICD-10-CM | POA: Insufficient documentation

## 2010-09-15 DIAGNOSIS — F419 Anxiety disorder, unspecified: Secondary | ICD-10-CM

## 2010-09-15 DIAGNOSIS — I639 Cerebral infarction, unspecified: Secondary | ICD-10-CM

## 2010-09-15 DIAGNOSIS — N400 Enlarged prostate without lower urinary tract symptoms: Secondary | ICD-10-CM | POA: Insufficient documentation

## 2010-09-15 DIAGNOSIS — F329 Major depressive disorder, single episode, unspecified: Secondary | ICD-10-CM

## 2010-09-15 DIAGNOSIS — E785 Hyperlipidemia, unspecified: Secondary | ICD-10-CM

## 2010-09-15 DIAGNOSIS — C61 Malignant neoplasm of prostate: Secondary | ICD-10-CM

## 2010-09-15 DIAGNOSIS — I10 Essential (primary) hypertension: Secondary | ICD-10-CM

## 2010-09-15 DIAGNOSIS — Z87442 Personal history of urinary calculi: Secondary | ICD-10-CM | POA: Insufficient documentation

## 2010-09-22 ENCOUNTER — Encounter: Payer: Self-pay | Admitting: Internal Medicine

## 2010-09-22 ENCOUNTER — Ambulatory Visit: Payer: Self-pay | Admitting: Radiation Oncology

## 2010-09-30 ENCOUNTER — Ambulatory Visit: Payer: Self-pay | Admitting: Radiation Oncology

## 2010-10-01 ENCOUNTER — Telehealth: Payer: Self-pay | Admitting: Internal Medicine

## 2010-10-04 ENCOUNTER — Encounter: Payer: Self-pay | Admitting: Internal Medicine

## 2010-10-07 NOTE — Progress Notes (Signed)
Summary: diazepam  Phone Note Refill Request Message from:  Fax from Pharmacy on October 01, 2010 9:35 AM  Refills Requested: Medication #1:  DIAZEPAM 5 MG TABS Take 1 tablet by mouth twice a day   Last Refilled: 07/22/2010 Refill request from [facility] [address] [phone]. Fax is on your desk.  Initial call taken by: Melody Comas,  October 01, 2010 9:36 AM  Follow-up for Phone Call        okay #60 x 1 Follow-up by: Cindee Salt MD,  October 01, 2010 1:27 PM  Additional Follow-up for Phone Call Additional follow up Details #1::        Rx faxed to pharmacy Additional Follow-up by: DeShannon Smith CMA Duncan Dull),  October 01, 2010 3:01 PM    Prescriptions: DIAZEPAM 5 MG TABS (DIAZEPAM) Take 1 tablet by mouth twice a day  #60 x 1   Entered by:   Mervin Hack CMA (AAMA)   Authorized by:   Cindee Salt MD   Signed by:   Mervin Hack CMA (AAMA) on 10/01/2010   Method used:   Handwritten   RxID:   [id number]

## 2010-10-12 NOTE — Letter (Signed)
Summary: [facility]   [facility]   Imported By: Kassie Mends 10/08/2010 08:21:57  _____________________________________________________________________  External Attachment:    Type:   Image     Comment:   External Document  Appended Document: [facility]  proceeding with prostate RT due to increasing PSA

## 2010-10-12 NOTE — Letter (Signed)
Summary: [facility]  [facility]   Imported By: Maryln Gottron 10/07/2010 15:41:14  _____________________________________________________________________  External Attachment:    Type:   Image     Comment:   External Document  Appended Document: [facility] seeds placed in preparation for RT to prostate

## 2010-10-17 LAB — TROPONIN I

## 2010-10-17 LAB — COMPREHENSIVE METABOLIC PANEL
ALT: 21 U/L (ref 0–53)
Alkaline Phosphatase: 57 U/L (ref 39–117)
BUN: 6 mg/dL (ref 6–23)
GFR calc Af Amer: >60 mL/min (ref >60)
GFR calc non Af Amer: >60 mL/min (ref >60)
Glucose, Bld: 115 mg/dL — ABNORMAL HIGH (ref 70–99)
Potassium: 4.5 mEq/L (ref 3.5–5.1)

## 2010-10-17 LAB — CBC
HCT: 52.8 % — ABNORMAL HIGH (ref 39.0–52.0)
Hemoglobin: 18.6 g/dL — ABNORMAL HIGH (ref 13.0–17.0)
MCHC: 35.2 g/dL (ref 30.0–36.0)
MCV: 95.4 fL (ref 78.0–100.0)
Platelets: 174 10*3/uL (ref 150–400)
RBC: 5.54 MIL/uL (ref 4.22–5.81)
RDW: 13.3 % (ref 11.5–15.5)
WBC: 5.9 10*3/uL (ref 4.0–10.5)

## 2010-10-17 LAB — GLUCOSE, CAPILLARY: Glucose-Capillary: 130 mg/dL — ABNORMAL HIGH (ref 70–99)

## 2010-10-17 LAB — PROTIME-INR
INR: 0.87 (ref 0.00–1.49)
Prothrombin Time: 11.7 s (ref 11.6–15.2)

## 2010-10-17 LAB — DIFFERENTIAL
Basophils Absolute: 0 10*3/uL (ref 0.0–0.1)
Basophils Relative: 0 % (ref 0–1)
Eosinophils Absolute: 0.1 10*3/uL (ref 0.0–0.7)
Eosinophils Relative: 2 % (ref 0–5)
Lymphocytes Relative: 16 % (ref 12–46)
Lymphs Abs: 0.9 10*3/uL (ref 0.7–4.0)
Monocytes Absolute: 0.5 10*3/uL (ref 0.1–1.0)
Monocytes Relative: 8 % (ref 3–12)
Neutro Abs: 4.4 10*3/uL (ref 1.7–7.7)
Neutrophils Relative %: 74 % (ref 43–77)

## 2010-10-17 LAB — APTT: aPTT: 32 seconds (ref 24–37)

## 2010-10-17 LAB — CK TOTAL AND CKMB (NOT AT ARMC)
CK, MB: 2.6 ng/mL (ref 0.3–4.0)
Relative Index: INVALID (ref 0.0–2.5)
Total CK: 87 U/L (ref 7–232)

## 2010-10-31 ENCOUNTER — Ambulatory Visit: Payer: Self-pay | Admitting: Radiation Oncology

## 2010-11-10 ENCOUNTER — Ambulatory Visit: Payer: Self-pay | Admitting: Internal Medicine

## 2010-11-10 DIAGNOSIS — Z0289 Encounter for other administrative examinations: Secondary | ICD-10-CM

## 2010-11-30 ENCOUNTER — Ambulatory Visit: Payer: Self-pay | Admitting: Radiation Oncology

## 2010-12-17 ENCOUNTER — Other Ambulatory Visit: Payer: Self-pay | Admitting: *Deleted

## 2010-12-17 NOTE — Telephone Encounter (Signed)
Fax is on your desk . 

## 2010-12-17 NOTE — Discharge Summary (Signed)
NAME:  [name] [name], [name] [name] NO.:  [id number]   MEDICAL RECORD NO.:  [id number]                   PATIENT TYPE:  INP   LOCATION:  5735                                 FACILITY:  [facility]   PHYSICIAN:  Oley Balm. Sung Amabile, M.D. [facility]          DATE OF BIRTH:  13-Jun-1956   DATE OF ADMISSION:  02/08/2004  DATE OF DISCHARGE:  02/09/2004                                 DISCHARGE SUMMARY   DISCHARGE DIAGNOSIS:  Hemoptysis.   HISTORY OF PRESENT ILLNESS:  [name] [name] is a 55 year old male who  presented with hemoptysis with a newly detected lung mass and was a smoker.  For these reasons, he was admitted for fiberoptic bronchoscopy.   LABORATORY DATA:  Bronchial washings demonstrated no malignant cells.  Wang  fine needle aspiration shows reactive bronchoepithelial cells without  malignant cells identified.  Glucose 90, BUN 10, sodium 136, potassium 4.3,  chloride 105, creatinine 1.2.  The pH 7.43, pCO2 37, bicarb 25.   HOSPITAL COURSE:  Hemoptysis with a lung mass:  [name] [name] was admitted to  [facility] where he underwent a fiberoptic bronchoscopy per Dr.  Shan Levans which demonstrated no endobronchial lesions, trace blood,  left upper lobe with a Wang needle to right upper lobe near the mass as  noted on CT.  The patient tolerated the procedure well.  Against medical  advice the next morning, he left.  Therefore, no further information is  available at this time.  He was not given a discharge instruction sheet.  He  was not given discharge medications due to the fact that he left against  medical advice prior to physician being able to round on him and establish  his needs.  Of note, on admission, he was on Norvasc 5 mg q. a.m., Xanax  0.25 mg q.4-6h. p.r.n. for nerves.   DISPOSITION/CONDITION ON DISCHARGE:  He was obviously ambulatory and left  against medical advice.      Brett Canales Minor, A.C.N.P. [facility]                 Oley Balm. Sung Amabile, M.D. [facility]    SM/MEDQ  D:  03/01/2004  T:  03/01/2004  Job:  [id number]

## 2010-12-17 NOTE — Op Note (Signed)
NAME:  [name] [name], [name] [name] NO.:  [id number]   MEDICAL RECORD NO.:  [id number]                   PATIENT TYPE:  INP   LOCATION:  5735                                 FACILITY:  [facility]   PHYSICIAN:  Shan Levans, M.D. [facility]            DATE OF BIRTH:  [birth year]   DATE OF PROCEDURE:  02/09/2004  DATE OF DISCHARGE:                                 OPERATIVE REPORT   PROCEDURE:  Bronchoscopy.   INDICATIONS FOR PROCEDURE:  Hemoptysis, right upper lobe fullness, evaluate  for cause.   OPERATOR:  Shan Levans, M.D. [facility]   ANESTHESIA:  1% Xylocaine local.   PREOP MEDICATIONS:  Demerol 60 mg IV push, Versed 6 mg IV push.   DESCRIPTION OF PROCEDURE:  The Olympus video bronchoscopy was introduced  through the oropharynx.  The upper airways are visualized and are  unremarkable.  The entire tracheobronchial tree was visualized and revealed  no endobronchial lesions.  Trace blood was seen in the left upper lobe, but  no endobronchial lesions were seen under close inspection.  Attention was  then paid to the right upper lobe.  With the air flow divider under  fluoroscopic control, Wang fine needle aspirations were obtained with air  fluid divider in the right upper lobe times four.  Bronchial washings were  obtained.   COMPLICATIONS:  None.   IMPRESSION:  Right upper lobe abnormality with hemoptysis, evaluate for  cause.   RECOMMENDATIONS:  Follow-up cytology.                                               Shan Levans, M.D. [facility]    PW/MEDQ  D:  02/09/2004  T:  02/09/2004  Job:  [id number]   cc:   Oley Balm. Sung Amabile, M.D. [facility]

## 2010-12-17 NOTE — H&P (Signed)
NAME:  [name] [name], [name] [name] NO.:  [id number]   MEDICAL RECORD NO.:  [id number]                   PATIENT TYPE:  INP   LOCATION:  5735                                 FACILITY:  [facility]   PHYSICIAN:  Oley Balm. Sung Amabile, M.D. [facility]          DATE OF BIRTH:  June 17, 1956   DATE OF ADMISSION:  02/08/2004  DATE OF DISCHARGE:                                HISTORY & PHYSICAL   ADMISSION DIAGNOSES:  1. Smoker.  2. Hemoptysis.  3. Newly detected lung mass on CT scan.   HISTORY OF PRESENT ILLNESS:  [name] [name] is a 55 year old gentleman with no  prior pulmonary history.  He smokes two packs of cigarettes per day.  He had  one episode of scant hemoptysis approximately one week ago.  He awoke on the  morning of admission with severe hemoptysis stating that he filled  approximately two or three inches of a Mason jar with blood.  Yesterday,  he did have some increase in mucopurulent secretions, but this was rather  mild.  What he is coughing up now is, pure blood.  He denies pleuritic and  anginal chest pain.  He has had no fevers.  He denies lower-extremity edema  and calf tenderness.  A CT scan of the chest demonstrated the findings  below, and that is what prompted this admission.   PAST MEDICAL HISTORY:  1. Hypertension.  2. Medically treated for hyperlipidemia though he denies a history of     elevated cholesterol.  3. Kidney stones.   MEDICATIONS:  1. Norvasc/Lipitor combination, unknown dose.  2. Potassium citrate for kidney stones.   SOCIAL HISTORY:  He smokes two packs of cigarettes per day.  He drinks  alcohol daily.  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   FAMILY HISTORY:  He is an only child.  Mother is alive and well in her 82's.  Father died recently of a brain tumor.   PHYSICAL EXAMINATION:  VITAL SIGNS:  Afebrile, normal vital signs.  GENERAL:  He is tearful, but in no acute distress.  HEENT:  Head and neck exam are without acute  abnormalities.  CHEST:  Examination reveals full breath sounds without wheezes or other  adventitious sounds.  CARDIAC:  Exam reveals regular rate and rhythm, no murmurs.  ABDOMEN:  Soft, nontender, with normal bowel sounds.  EXTREMITIES:  Without clubbing, cyanosis or edema.  NEUROLOGIC:  Without focal deficits.   LABORATORY DATA:  CT scan of the chest demonstrates a nodular density with  surrounding soft-tissue density or air-space disease adjacent to the right  side of the trachea just above the takeoff of the right upper lobe bronchus.  Otherwise, CT scan of the chest is essentially normal.   IMPRESSION:  Hemoptysis in a smoker with newly detected lung mass in the  right upper lobe.  Concern is obviously for malignancy.  The degree of his  hemoptysis is substantial enough  that it warrants hospitalization for  monitoring.   PLAN:  He will be admitted to a regular bed.  I have discussed the results  of the  CT scan with him, and we will plan on bronchoscopy on the day  following admission.  He desires something for anxiety, and I have written  for Xanax.  I have also written for codeine as a cough suppressant.  Further  evaluation and management will be dictated by bronchoscopic findings.  If  bronchoscopy is not diagnostic, I believe mediastinoscopy is warranted.                                                Oley Balm Sung Amabile, M.D. [facility]    DBS/MEDQ  D:  02/08/2004  T:  02/08/2004  Job:  [id number]   cc:   [name], Dr.

## 2010-12-17 NOTE — Op Note (Signed)
NAME:  [name] [name], [name] [name] NO.:  [id number]   MEDICAL RECORD NO.:  [id number]                   PATIENT TYPE:  OIB   LOCATION:  2899                                 FACILITY:  [facility]   PHYSICIAN:  Ines Bloomer, M.D.              DATE OF BIRTH:  [birth year]   DATE OF PROCEDURE:  02/27/2004  DATE OF DISCHARGE:                                 OPERATIVE REPORT   PREOPERATIVE DIAGNOSIS:  Right paratracheal mass.   POSTOPERATIVE DIAGNOSIS:  Right paratracheal mass.   OPERATION:  Fiberoptic bronchoscopy with mediastinoscopy.   INDICATIONS:  This patient presented with an inflammation in the medial  portion of the right upper lobe.  Previous attempts at biopsies were  negative.  A PET scan just showed slight increase uptake.  The patient was  brought to the operating room of biopsy in this area.   DESCRIPTION OF PROCEDURE:  After general anesthesia, fiberoptic bronchoscope  was passed through the right upper lobe, right middle lobe, and right lower  lobe.  It was stable on the right.  The left main stem, left upper lobe, and  left lower lobe portions were normal.  Cytologies and cultures were taken  from the tracheal bronchial tree and then the fiberoptic bronchoscope was  removed. The anterior neck was prepped and draped in the usual sterile  manner.   A transverse incision was made above the sternal notch and dissection was  carried down through the subcutaneous tissue to the pretracheal fascia.  The  biopsies of four R nodes were done and this area was marked with small  Ligaclips.  The mediastinoscope was removed.  The strap muscles were closed  with 2-0 Vicryl and the subcutaneous with 3-0 Vicryl.  Frozen section  revealed an inflammatory node.  Dermabond for the skin.  The patient  returned to the recovery room in stable condition.                                               Ines Bloomer, M.D.    DPB/MEDQ  D:  02/27/2004  T:  02/27/2004   Job:  [id number]   cc:   Oley Balm. Sung Amabile, M.D. [facility]

## 2010-12-18 NOTE — Telephone Encounter (Signed)
Okay #60 x 1 

## 2010-12-20 MED ORDER — DIAZEPAM 5 MG PO TABS: ORAL_TABLET | ORAL | Status: DC

## 2010-12-20 NOTE — Telephone Encounter (Signed)
rx called to pharmacy 

## 2010-12-20 NOTE — Telephone Encounter (Signed)
Duplicate

## 2010-12-31 ENCOUNTER — Ambulatory Visit: Payer: Self-pay | Admitting: Radiation Oncology

## 2011-02-10 ENCOUNTER — Other Ambulatory Visit: Payer: Self-pay | Admitting: *Deleted

## 2011-02-10 MED ORDER — DIAZEPAM 5 MG PO TABS: ORAL_TABLET | ORAL | Status: DC

## 2011-02-10 NOTE — Telephone Encounter (Signed)
rx faxed to pharmacy manually  

## 2011-02-10 NOTE — Telephone Encounter (Signed)
Faxed form from [facility] [address] is on your desk.

## 2011-02-10 NOTE — Telephone Encounter (Signed)
Okay #60 x 0 

## 2011-03-30 ENCOUNTER — Other Ambulatory Visit: Payer: Self-pay | Admitting: *Deleted

## 2011-03-30 MED ORDER — DIAZEPAM 5 MG PO TABS: ORAL_TABLET | ORAL | Status: DC

## 2011-03-30 NOTE — Telephone Encounter (Signed)
Ok to fill? Patient no-showed for last appt and didn't reschedule.

## 2011-03-30 NOTE — Telephone Encounter (Signed)
rx called into pharmacy .left message to have patient call for appt.

## 2011-03-30 NOTE — Telephone Encounter (Signed)
Okay #60 x 0 Have him reschedule though

## 2011-05-19 ENCOUNTER — Other Ambulatory Visit: Payer: Self-pay | Admitting: *Deleted

## 2011-05-19 MED ORDER — DIAZEPAM 5 MG PO TABS: ORAL_TABLET | ORAL | Status: DC

## 2011-05-19 NOTE — Telephone Encounter (Signed)
Received faxed refill request from pharmacy. Patient was to schedule an appointment when medication was filled last time. No appointment scheduled.

## 2011-05-19 NOTE — Telephone Encounter (Signed)
Okay #30 x 0 Needs to schedule appt soon

## 2011-05-19 NOTE — Telephone Encounter (Signed)
Rx called to pharmacy as instructed by telephone.  Left message on machine for patient to call back.

## 2011-05-20 ENCOUNTER — Other Ambulatory Visit: Payer: Self-pay | Admitting: *Deleted

## 2011-05-20 MED ORDER — DIAZEPAM 5 MG PO TABS: ORAL_TABLET | ORAL | Status: DC

## 2011-05-20 NOTE — Telephone Encounter (Signed)
LMOVM of pharmacy to confirm receipt of Rx from yesterday.

## 2011-05-20 NOTE — Telephone Encounter (Signed)
This was done yesterday Please confirm with the pharmacy

## 2011-06-01 NOTE — Telephone Encounter (Signed)
Left message on machine that pt needs to be seen for further refills.

## 2012-06-27 ENCOUNTER — Ambulatory Visit (INDEPENDENT_AMBULATORY_CARE_PROVIDER_SITE_OTHER): Payer: Self-pay | Admitting: Family Medicine

## 2012-06-27 ENCOUNTER — Ambulatory Visit (INDEPENDENT_AMBULATORY_CARE_PROVIDER_SITE_OTHER)
Admission: RE | Admit: 2012-06-27 | Discharge: 2012-06-27 | Disposition: A | Discharge status: Home / Self Care | Payer: Self-pay | Source: Ambulatory Visit | Attending: Family Medicine | Admitting: Family Medicine

## 2012-06-27 ENCOUNTER — Encounter: Payer: Self-pay | Admitting: Family Medicine

## 2012-06-27 VITALS — BP 120/76 | HR 115 | Temp 97.7°F | Ht 68.0 in | Wt 154.2 lb

## 2012-06-27 DIAGNOSIS — R079 Chest pain, unspecified: Secondary | ICD-10-CM

## 2012-06-27 DIAGNOSIS — J159 Unspecified bacterial pneumonia: Secondary | ICD-10-CM

## 2012-06-27 MED ORDER — DOXYCYCLINE HYCLATE 100 MG PO TABS: 100.0000 mg | ORAL_TABLET | Freq: Two times a day (BID) | ORAL | Status: DC

## 2012-06-27 MED ORDER — PREDNISONE 20 MG PO TABS: ORAL_TABLET | ORAL | Status: DC

## 2012-06-27 NOTE — Progress Notes (Signed)
[facility] [address] [place] [address] Phone: [phone] Fax: [phone]  Date:  06/27/2012   Name:  [name] [name]   DOB:  Aug 29, 1955   MRN:  [id number] Gender: male Age: 56 y.o.  PCP:  [name], MD  Evaluating MD: Hannah Beat, MD   Chief Complaint: Cough, Anorexia, Chest Pain, Nasal Congestion, Fatigue and Shortness of Breath   History of Present Illness:  [name] [name] is a 56 y.o. pleasant patient who presents with the following:  Pleasant gentleman with h/o CVA, adenocarcinoma of prostate, lipids, heavy tobacco history, severe depression, HTN:  Severe cough, sounds bad here in the office. Has some chest pain, decreased appetite for about 2 weeks. Overall, per wife (separated) he has been "checked out" since he had his stroke. He has stopped his Plavix, stopped all cardiac medication, but he does take an aspirin a day. Normtensive in the office today.  Had a stroke in 2010.  Minimal nasal congestion.  But generally feels terrible.  Aches all over  Bad chest pain with multiple cardiac risk factors, + CVA, tobacco heavy, lipids, HTN.  Smoking for 40 years or so.   Rocephin Doxy  Patient Active Problem List  Diagnosis  . ADENOCARCINOMA, PROSTATE  . HYPERLIPIDEMIA  . ANXIETY  . DYSTHYMIA  . DEPRESSION  . HYPERTENSION  . CVA WITH RIGHT HEMIPARESIS  . RENAL CALCULUS  . BENIGN PROSTATIC HYPERTROPHY  . INGUINAL PAIN, LEFT  . IMPAIRED FASTING GLUCOSE  . ACTINIC KERATOSIS  . Prostate cancer  . Impaired glucose tolerance  . HLD (hyperlipidemia)  . HTN (hypertension)  . BPH (benign prostatic hypertrophy)  . History of kidney stones  . Anxiety  . CVA (cerebral infarction)  . Depression    Past Medical History  Diagnosis Date  . Prostate cancer 10/2005    Dr. Achilles Dunk [phone])  . Impaired glucose tolerance   . HLD (hyperlipidemia)   . HTN (hypertension) 1992  . BPH (benign prostatic hypertrophy)   .  History of kidney stones   . Anxiety   . CVA (cerebral infarction) 07/2009  . Depression     Past Surgical History  Procedure Date  . Ureterscopic stone removal 09/2002  . Lung biopsy 01/2004    neg cancer  . Rectal examination under anesthesia w/ cystoscopy 09/2005    BPH only  . Hifu 12/2005    prostate cancer  . Lih 09/2008    Dr. Achilles Dunk    History  Substance Use Topics  . Smoking status: Current Every Day Smoker  . Smokeless tobacco: Not on file  . Alcohol Use: Yes    Family History  Problem Relation Age of Onset  . Coronary artery disease Father   . Brain cancer Father     tumor  . Hypertension Mother   . Diabetes Mother     early  . Dementia Mother     Allergies  Allergen Reactions  . Erythromycin Ethylsuccinate     REACTION: unspecified  . Sildenafil     REACTION: just didn't tolerate    Medication list has been reviewed and updated.  Outpatient Prescriptions Prior to Visit  Medication Sig Dispense Refill  . aspirin 325 MG EC tablet Take 325 mg by mouth daily.        . clopidogrel (PLAVIX) 75 MG tablet Take 75 mg by mouth daily. For stroke prevention       . diazepam (VALIUM) 5 MG tablet Take 1 tablet by mouth twice  a day as needed for nerves  30 tablet  0  . diltiazem (DILACOR XR) 180 MG 24 hr capsule Take 180 mg by mouth daily.        Marland Kitchen FLUoxetine (PROZAC) 20 MG capsule Take 20 mg by mouth daily. To help mood       . losartan (COZAAR) 100 MG tablet Take 100 mg by mouth daily. For high blood pressure       . potassium chloride (KLOR-CON) 10 MEQ CR tablet Take 10 mEq by mouth daily.        . simvastatin (ZOCOR) 10 MG tablet Take 10 mg by mouth at bedtime. To lower cholesterol       . Tamsulosin HCl (FLOMAX) 0.4 MG CAPS Take 0.4 mg by mouth daily.         Last reviewed on 06/27/2012 11:18 AM by Consuello Masse, CMA  Review of Systems:  Generally feels terrible, weak, achy, congestion, coughing, chest pain, shortness of breath. 20 pound weight loss in  the last year. Decreased eating over the last year.   Physical Examination: Filed Vitals:   06/27/12 1100  BP: 120/76  Pulse: 115  Temp: 97.7 F (36.5 C)  TempSrc: Oral  Height: 5\' 8"  (1.727 m)  Weight: 154 lb 4 oz (69.967 kg)  SpO2: 96%  Pulse 85 on my recheck  Body mass index is 23.45 kg/(m^2). Ideal Body Weight: Weight in (lb) to have BMI = 25: 164.1   GEN: generally unkempt, moderately ill-appearing male. A and O x 3 HEENT: Atraumatic, Normocephalic. Neck supple. No masses, No LAD. Ears and Nose: No external deformity. CV: RRR, No M/G/R.  PULM: No tachypnea, RR 16. No increased WOB. Diffuse decreased BS with rhonchi in b bases and scattered wheezing. ABD: S, NT, ND, +BS. No rebound. No HSM. EXTR: No c/c/e NEURO Normal gait.  PSYCH: blunted affect   Assessment and Plan:  1. Pneumonia, bacterial  doxycycline (VIBRA-TABS) 100 MG tablet, predniSONE (DELTASONE) 20 MG tablet  2. Chest pain  EKG 12-Lead, DG Chest 2 View   Highly concerning case in a patient globally doing poorly and medically non-compliant.  Clinically with probable PNA on exam and probable COPD baseline with 40+ tobacco history with exacerbation.  To me, there does appear to be concern for RML consolidation on CXR, which could explain his symptoms.  EKG: Normal sinus rhythm. Normal axis, v1 and v2, there does appear what could be q, representing old infarct, No acute ST elevation or depression. Overall, r wave progression is relatively normal.  He declines all other evaluation. Discussed hospitalization reasonable, but declined. Strongly recommended close cardiology f/u, but declined. Explained that we could be missing potentially life-threatening problem.  I am going to discuss the case with my partner Dr. Alphonsus Sias his PCP on Monday.  I gave Rocephin 1 gram in the office IM + doxy and prednisone  Orders Today:  Orders Placed This Encounter  Procedures  . DG Chest 2 View    Standing Status: Future      Number of Occurrences: 1     Standing Expiration Date: 08/27/2013    Order Specific Question:  Preferred imaging location?    Answer:  [facility]    Order Specific Question:  Reason for exam:    Answer:  severe cough, productive, chest pain  . EKG 12-Lead    Updated Medication List: (Includes new medications, updates to list, dose adjustments) Meds ordered this encounter  Medications  . doxycycline (VIBRA-TABS) 100 MG  tablet    Sig: Take 1 tablet (100 mg total) by mouth 2 (two) times daily.    Dispense:  20 tablet    Refill:  0  . predniSONE (DELTASONE) 20 MG tablet    Sig: 2 tabs po for 5 days, then 1 tab po for 5 days    Dispense:  15 tablet    Refill:  0    Medications Discontinued: Medications Discontinued During This Encounter  Medication Reason  . clopidogrel (PLAVIX) 75 MG tablet   . diazepam (VALIUM) 5 MG tablet   . diltiazem (DILACOR XR) 180 MG 24 hr capsule   . FLUoxetine (PROZAC) 20 MG capsule   . losartan (COZAAR) 100 MG tablet   . potassium chloride (KLOR-CON) 10 MEQ CR tablet   . simvastatin (ZOCOR) 10 MG tablet   . Tamsulosin HCl (FLOMAX) 0.4 MG CAPS      Hannah Beat, MD

## 2012-07-03 ENCOUNTER — Telehealth: Payer: Self-pay | Admitting: *Deleted

## 2012-07-03 NOTE — Telephone Encounter (Signed)
Spoke with wife and she states pt is doing much better, she will try and get him back in the office for a visit, they do not live together but she does look after his care.

## 2012-07-03 NOTE — Telephone Encounter (Signed)
Dee, Please call him on Monday and try to get him to come in for a follow up this week or next at the latest  Rich

## 2012-07-03 NOTE — Telephone Encounter (Signed)
Noted  

## 2012-08-08 ENCOUNTER — Inpatient Hospital Stay: Payer: Self-pay | Admitting: Psychiatry

## 2012-08-08 LAB — CK TOTAL AND CKMB (NOT AT ARMC)
CK, Total: 22 U/L — ABNORMAL LOW (ref 35–232)
CK-MB: <0.5 ng/mL — ABNORMAL LOW (ref 0.5–3.6)

## 2012-08-08 LAB — CBC
HCT: 35 % — ABNORMAL LOW (ref 40.0–52.0)
HGB: 10.8 g/dL — ABNORMAL LOW (ref 13.0–18.0)
MCH: 26.5 pg (ref 26.0–34.0)
MCHC: 30.9 g/dL — ABNORMAL LOW (ref 32.0–36.0)
MCV: 86 fL (ref 80–100)
Platelet: 350 10*3/uL (ref 150–440)
WBC: 7.3 10*3/uL (ref 3.8–10.6)

## 2012-08-08 LAB — DRUG SCREEN, URINE
Amphetamines, Ur Screen: NEGATIVE (ref ?–1000)
Barbiturates, Ur Screen: NEGATIVE (ref ?–200)
Cannabinoid 50 Ng, Ur ~~LOC~~: NEGATIVE (ref ?–50)
Cocaine Metabolite,Ur ~~LOC~~: NEGATIVE (ref ?–300)
Methadone, Ur Screen: NEGATIVE (ref ?–300)
Opiate, Ur Screen: NEGATIVE (ref ?–300)

## 2012-08-08 LAB — URINALYSIS, COMPLETE
Glucose,UR: NEGATIVE mg/dL (ref 0–75)
Nitrite: NEGATIVE
Ph: 5 (ref 4.5–8.0)
RBC,UR: 10 /HPF (ref 0–5)
Squamous Epithelial: 1
WBC UR: 2 /HPF (ref 0–5)

## 2012-08-08 LAB — COMPREHENSIVE METABOLIC PANEL
BUN: 12 mg/dL (ref 7–18)
Bilirubin,Total: 1.5 mg/dL — ABNORMAL HIGH (ref 0.2–1.0)
Co2: 22 mmol/L (ref 21–32)
Creatinine: 1.09 mg/dL (ref 0.60–1.30)
EGFR (African American): >60
EGFR (Non-African Amer.): >60
Glucose: 141 mg/dL — ABNORMAL HIGH (ref 65–99)
SGPT (ALT): 12 U/L (ref 12–78)

## 2012-08-08 LAB — T4, FREE: Free Thyroxine: 1.14 ng/dL (ref 0.76–1.46)

## 2012-08-09 LAB — BEHAVIORAL MEDICINE 1 PANEL
Albumin: 2 g/dL — ABNORMAL LOW (ref 3.4–5.0)
Alkaline Phosphatase: 72 U/L (ref 50–136)
BUN: 9 mg/dL (ref 7–18)
Basophil #: 0.1 10*3/uL (ref 0.0–0.1)
Basophil %: 1.3 %
Bilirubin,Total: 1.3 mg/dL — ABNORMAL HIGH (ref 0.2–1.0)
Co2: 22 mmol/L (ref 21–32)
EGFR (African American): >60
EGFR (Non-African Amer.): >60
Eosinophil %: 2.2 %
Lymphocyte %: 14 %
MCH: 29.8 pg (ref 26.0–34.0)
MCHC: 35 g/dL (ref 32.0–36.0)
Monocyte %: 6.9 %
RDW: 16.1 % — ABNORMAL HIGH (ref 11.5–14.5)
SGOT(AST): 14 U/L — ABNORMAL LOW (ref 15–37)
SGPT (ALT): 9 U/L — ABNORMAL LOW (ref 12–78)
Sodium: 136 mmol/L (ref 136–145)

## 2012-08-10 LAB — IRON AND TIBC
Iron Saturation: 29 %
Iron: 43 ug/dL — ABNORMAL LOW (ref 65–175)
Unbound Iron-Bind.Cap.: 106 ug/dL

## 2012-08-10 LAB — RETICULOCYTES: Absolute Retic Count: 0.0969 10*6/uL (ref 0.031–0.129)

## 2012-08-10 LAB — FERRITIN: Ferritin (ARMC): 435 ng/mL — ABNORMAL HIGH (ref 8–388)

## 2012-08-10 LAB — CBC WITH DIFFERENTIAL/PLATELET
Basophil #: 0.1 10*3/uL (ref 0.0–0.1)
Basophil %: 1.3 %
HGB: 9.1 g/dL — ABNORMAL LOW (ref 13.0–18.0)
Lymphocyte #: 0.8 10*3/uL — ABNORMAL LOW (ref 1.0–3.6)
Monocyte #: 0.3 x10 3/mm (ref 0.2–1.0)
Monocyte %: 5.9 %
Platelet: 317 10*3/uL (ref 150–440)
RBC: 3.23 10*6/uL — ABNORMAL LOW (ref 4.40–5.90)
RDW: 15.9 % — ABNORMAL HIGH (ref 11.5–14.5)

## 2012-08-10 LAB — MAGNESIUM: Magnesium: 1.8 mg/dL

## 2012-08-12 LAB — OCCULT BLOOD X 1 CARD TO LAB, STOOL: Occult Blood, Feces: NEGATIVE

## 2012-08-24 ENCOUNTER — Telehealth: Payer: Self-pay

## 2012-08-24 NOTE — Telephone Encounter (Signed)
Spoke to her Has had worsening depression Separated in 2012 and she would check on him every once in a while Rarely went out  Hadn't taken the meds or followed through with counselor  Then respiratory illness Seen here Still worsened and finally couldn't walk Called EMTs on 1/8-- to ER Pneumonia and malnutrition diagnosed Depression severe Involuntary commitment apparently  I will try to call him in the next few days to try to draw him in

## 2012-08-24 NOTE — Telephone Encounter (Signed)
Called and message left Will have to try back later

## 2012-08-24 NOTE — Telephone Encounter (Signed)
pts wife said pt was seen in 06/2012 for pneumonia; pt presently in hospital and Mrs Poole wants Dr Alphonsus Sias to know that pt is behavioral unit at [facility] and pt also has pneumonia. Pt did get better after being seen in 06/2012 and pt was not eating right and having problem with walking and was seen in [facility] ER in Jan where he was admitted. Pt's wife and pt is not living together now. Mrs Finkler wants to talk with Dr Alphonsus Sias about pt's physical and mental condition. Please advise.

## 2012-08-26 LAB — BASIC METABOLIC PANEL
Anion Gap: 10 (ref 7–16)
Calcium, Total: 8.4 mg/dL — ABNORMAL LOW (ref 8.5–10.1)
Chloride: 106 mmol/L (ref 98–107)
Co2: 23 mmol/L (ref 21–32)
Creatinine: 0.86 mg/dL (ref 0.60–1.30)
Glucose: 92 mg/dL (ref 65–99)
Potassium: 3.5 mmol/L (ref 3.5–5.1)

## 2012-09-04 ENCOUNTER — Encounter: Payer: Self-pay | Admitting: Internal Medicine

## 2012-09-04 ENCOUNTER — Ambulatory Visit (INDEPENDENT_AMBULATORY_CARE_PROVIDER_SITE_OTHER): Payer: Self-pay | Admitting: Internal Medicine

## 2012-09-04 VITALS — BP 120/80 | HR 116 | Temp 98.4°F | Wt 139.0 lb

## 2012-09-04 DIAGNOSIS — F329 Major depressive disorder, single episode, unspecified: Secondary | ICD-10-CM

## 2012-09-04 DIAGNOSIS — R000 Tachycardia, unspecified: Secondary | ICD-10-CM | POA: Insufficient documentation

## 2012-09-04 DIAGNOSIS — R222 Localized swelling, mass and lump, trunk: Secondary | ICD-10-CM

## 2012-09-04 DIAGNOSIS — R918 Other nonspecific abnormal finding of lung field: Secondary | ICD-10-CM | POA: Insufficient documentation

## 2012-09-04 NOTE — Progress Notes (Signed)
Subjective:    Patient ID: [name] [name], male    DOB: 09/10/1955, 57 y.o.   MRN: [id number]  HPI Here with (estranged) wife Reviewed [facility] records Hospitalized in mental health due to severe depression (against his wishes) Endobronchial lung lesion found also  Living alone Able to drive and has gone out now to eat (and buy shoes) Forgot what happened on Sunday No real support here Wife willing to check on him some (or her children)  Still some cough Occasional sputum  Still depressed He doesn't think the meds have helped but his wife notes an improvement (able to shave himself, get out for food, etc)  Current Outpatient Prescriptions on File Prior to Visit  Medication Sig Dispense Refill  . albuterol-ipratropium (COMBIVENT) 18-103 MCG/ACT inhaler Inhale 1 puff into the lungs 4 (four) times daily.      . ARIPiprazole (ABILIFY) 10 MG tablet Take 10 mg by mouth daily.      . mirtazapine (REMERON) 45 MG tablet Take 45 mg by mouth at bedtime.      . traZODone (DESYREL) 50 MG tablet Take 50 mg by mouth at bedtime.        Allergies  Allergen Reactions  . Erythromycin Ethylsuccinate     REACTION: unspecified  . Sildenafil     REACTION: just didn't tolerate    Past Medical History  Diagnosis Date  . Prostate cancer 10/2005    Dr. Achilles Dunk [phone])  . Impaired glucose tolerance   . HLD (hyperlipidemia)   . HTN (hypertension) 1992  . BPH (benign prostatic hypertrophy)   . History of kidney stones   . Anxiety   . CVA (cerebral infarction) 07/2009  . Depression     Past Surgical History  Procedure Date  . Ureterscopic stone removal 09/2002  . Lung biopsy 01/2004    neg cancer  . Rectal examination under anesthesia w/ cystoscopy 09/2005    BPH only  . Hifu 12/2005    prostate cancer  . Lih 09/2008    Dr. Achilles Dunk    Family History  Problem Relation Age of Onset  . Coronary artery disease Father   . Brain cancer Father     tumor  . Hypertension Mother   . Diabetes  Mother     early  . Dementia Mother     History   Social History  . Marital Status: Married    Spouse Name: N/A    Number of Children: 2  . Years of Education: N/A   Occupational History  . [employer] Self Employed   Social History Main Topics  . Smoking status: Current Every Day Smoker  . Smokeless tobacco: Not on file  . Alcohol Use: Yes  . Drug Use: Not on file  . Sexually Active: Not on file   Other Topics Concern  . Not on file   Social History Narrative  . No narrative on file    Review of Systems Had trouble sleeping in past---but now sleeping okay Has continued to lose weight    Objective:   Physical Exam  Neck: Normal range of motion. Neck supple.  Cardiovascular: Regular rhythm and normal heart sounds.  Exam reveals no gallop.   No murmur heard.      Rate ~160  Pulmonary/Chest: No respiratory distress. He has no wheezes. He has no rales.       Decreased breath sounds on the right  Musculoskeletal: He exhibits no edema and no tenderness.  Psychiatric:  Disheveled, little eye contact Some tears Constricted affect          Assessment & Plan:

## 2012-09-04 NOTE — Assessment & Plan Note (Signed)
Very fast when I examined him but settled down EKG showed rate of 96 and just p pulmonale

## 2012-09-04 NOTE — Assessment & Plan Note (Signed)
Endobronchial on the right  Almost certainly cancer CT scan indicates this would not be resectable but discussed chemo and he would probably accept Needs tissue diagnosis  Will set up with pulmonary Probably would want cancer Rx in [place] though

## 2012-09-04 NOTE — Assessment & Plan Note (Signed)
Major depression Involuntary admission to mental health for which he is very resentful Advised him to keep up with the psychiatrist (has appt in 2 days)

## 2012-09-05 ENCOUNTER — Encounter: Payer: Self-pay | Admitting: Internal Medicine

## 2012-09-05 ENCOUNTER — Ambulatory Visit (INDEPENDENT_AMBULATORY_CARE_PROVIDER_SITE_OTHER): Payer: Self-pay | Admitting: Internal Medicine

## 2012-09-05 VITALS — BP 110/80 | HR 90 | Temp 97.3°F | Ht 69.0 in | Wt 141.8 lb

## 2012-09-05 DIAGNOSIS — R222 Localized swelling, mass and lump, trunk: Secondary | ICD-10-CM

## 2012-09-05 DIAGNOSIS — R918 Other nonspecific abnormal finding of lung field: Secondary | ICD-10-CM

## 2012-09-05 DIAGNOSIS — J449 Chronic obstructive pulmonary disease, unspecified: Secondary | ICD-10-CM

## 2012-09-05 MED ORDER — DOXYCYCLINE HYCLATE 100 MG PO TABS: 100.0000 mg | ORAL_TABLET | Freq: Two times a day (BID) | ORAL | Status: DC

## 2012-09-05 NOTE — Progress Notes (Signed)
Subjective:    Patient ID: [name] [name], male    DOB: 17-Jan-1956, 57 y.o.   MRN: [id number]  HPI  [name] Abide, MD is PCP  Body mass index is 20.94 kg/(m^2).   reports that he has been smoking Cigarettes.  He has a 42 pack-year smoking history. He does not have any smokeless tobacco history on file.   IOV 09/05/2012  History is provided by his wife mainly and some by him  This is a 57 year old male. 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 In 2010 he was diagnosed with prostate cancer and subsequent to this his depression worsened and 2011 and a recurrence of prostate cancer and underwent radiation therapy. But during all this his depression was so bad that he lost his business and was emotionally and physically but not legally separated from his wife and and started living alone. Then since 2011 wife started noticing progressive depression and weight loss and self neglect that during all these years he started refusing medical treatment and doctor visits. Then in November 2013 he abruptly became more optimistic and wanted to see a physician but was diagnosed with pneumonia this time based on a chest x-ray and given antibiotics.  Than 08/08/2012 he was [facility] and [facility] for [facility]. The course at the [facility] was characterized by one of noncompliance but slow improvement to the point that he was discharged on 09/01/2012. During this time he had a CT scan of the chest January 20 oh 2014 that showed right lower lobe and right middle lobe bronchial obstruction with distal infiltrates very suggestive of lung cancer. Size of the mass was 1.3 cm on image 50. There was thought to be some right hilar adenopathy but this was not well visualized on the noncontrast CT. I personally confirmed these findings. Apparently he refuse lung biopsy at this time.  However post discharge  from behavioral health 4 days ago he is now express an interest to know his diagnosis and therefore he has been referred here. He says that he would like to know his diagnosis but is not certain whether he would want to undergo a lung cancer treatment. According to his wife he might change his mind but this would be unlikely.  Again they're very concerned about insurance status.  In terms of his symptoms he is in chronic cough with phlegm. Wife has noticed hemoptysis a few times recently the past few weeks mild amounts   Spirometry FEV1 1.6 L 44% with ratio 73 Past Medical History  Diagnosis Date  . Prostate cancer 10/2005    Dr. Achilles Dunk [phone])  . Impaired glucose tolerance   . HLD (hyperlipidemia)   . HTN (hypertension) 1992  . BPH (benign prostatic hypertrophy)   . History of kidney stones   . Anxiety   . CVA (cerebral infarction) 07/2009  . Depression      Family History  Problem Relation Age of Onset  . Coronary artery disease Father   . Brain cancer Father     tumor  . Hypertension Mother   . Diabetes Mother     early  . Dementia Mother      History   Social History  . Marital Status: Married    Spouse Name: N/A    Number of Children: 2  . Years of Education: N/A   Occupational History  . [employer]  Social History Main Topics  . Smoking status: Current Every Day Smoker -- 1.0 packs/day for 42 years    Types: Cigarettes  . Smokeless tobacco: Not on file  . Alcohol Use: Yes  . Drug Use: Not on file  . Sexually Active: Not on file   Other Topics Concern  . Not on file   Social History Narrative  . No narrative on file     Allergies  Allergen Reactions  . Erythromycin Ethylsuccinate     REACTION: unspecified  . Sildenafil     REACTION: just didn't tolerate     Outpatient Prescriptions Prior to Visit  Medication Sig Dispense Refill  . albuterol-ipratropium (COMBIVENT) 18-103 MCG/ACT inhaler Inhale 1 puff into the  lungs 4 (four) times daily.      . ARIPiprazole (ABILIFY) 10 MG tablet Take 10 mg by mouth daily.      . mirtazapine (REMERON) 45 MG tablet Take 45 mg by mouth at bedtime.      . traZODone (DESYREL) 50 MG tablet Take 50 mg by mouth at bedtime.       Last reviewed on 09/04/2012 12:01 PM by Karie Schwalbe, MD   > Review of Systems  Constitutional: Positive for appetite change and unexpected weight change. Negative for fever.  HENT: Positive for congestion. Negative for ear pain, nosebleeds, sore throat, rhinorrhea, sneezing, trouble swallowing, dental problem, postnasal drip and sinus pressure.   Eyes: Negative for redness and itching.  Respiratory: Positive for cough and shortness of breath. Negative for chest tightness and wheezing.   Cardiovascular: Negative for palpitations and leg swelling.  Gastrointestinal: Negative for nausea and vomiting.  Genitourinary: Negative for dysuria.  Musculoskeletal: Negative for joint swelling.  Skin: Negative for rash.  Neurological: Negative for headaches.  Hematological: Does not bruise/bleed easily.  Psychiatric/Behavioral: Positive for dysphoric mood. The patient is nervous/anxious.        Objective:   Physical Exam  Nursing note and vitals reviewed. Constitutional: He is oriented to person, place, and time. He appears well-developed and well-nourished. No distress.       Disheveled Body mass index is 20.94 kg/(m^2).   HENT:  Head: Normocephalic and atraumatic.  Right Ear: External ear normal.  Left Ear: External ear normal.  Mouth/Throat: Oropharynx is clear and moist. No oropharyngeal exudate.  Eyes: Conjunctivae normal and EOM are normal. Pupils are equal, round, and reactive to light. Right eye exhibits no discharge. Left eye exhibits no discharge. No scleral icterus.  Neck: Normal range of motion. Neck supple. No JVD present. No tracheal deviation present. No thyromegaly present.  Cardiovascular: Normal rate, regular rhythm and  intact distal pulses.  Exam reveals no gallop and no friction rub.   No murmur heard. Pulmonary/Chest: Effort normal and breath sounds normal. No respiratory distress. He has no wheezes. He has no rales. He exhibits no tenderness.       Coughs periodically coarse breath sounds  Abdominal: Soft. Bowel sounds are normal. He exhibits no distension and no mass. There is no tenderness. There is no rebound and no guarding.  Musculoskeletal: Normal range of motion. He exhibits no edema and no tenderness.  Lymphadenopathy:    He has no cervical adenopathy.  Neurological: He is alert and oriented to person, place, and time. He has normal reflexes. No cranial nerve deficit. Coordination normal.  Skin: Skin is warm and dry. No rash noted. He is not diaphoretic. No erythema. No pallor.  Psychiatric:       Depressed and flat  affect          Assessment & Plan:  \

## 2012-09-05 NOTE — Patient Instructions (Addendum)
#  smoking history  -will disuss dangers of smoking during later visit though with severe depression quitting smoking might not be possible   #COPD  - severe on spirometry  - start spiriva samples 1 puff daily  #lung mass  - do PET scan  #Possible acute bronchitis - Take doxycycline 100mg  po twice daily x 5 days; take after meals and avoid sunlight. IF expensive call us   #Followup Based on PET scan results My administrator will give you information on [place] financial support line

## 2012-09-06 DIAGNOSIS — J449 Chronic obstructive pulmonary disease, unspecified: Secondary | ICD-10-CM | POA: Insufficient documentation

## 2012-09-06 NOTE — Assessment & Plan Note (Signed)
Do PET scan. Followup based on PET scan

## 2012-09-06 NOTE — Assessment & Plan Note (Signed)
Start empiric Spiriva

## 2012-09-11 ENCOUNTER — Telehealth: Payer: Self-pay

## 2012-09-11 NOTE — Telephone Encounter (Signed)
Pts wife,Pat left v/m pt lives in [place] and Dike lives in [place]; [place] concerned about pt staying by himself. Pt is not eating and getting med mixed up. Pat spoke with Lana Fish at [facility]; Pat request referral to Hospice for pt to have hospice services from pallative care.Please advise.

## 2012-09-11 NOTE — Telephone Encounter (Signed)
We cannot make referral to hospice until we know whether he will be considering treatment for probable cancer

## 2012-09-11 NOTE — Telephone Encounter (Signed)
.  left message at home and cell to have patient return my call. Left message on cell phone VM, advised pt to call if any questions

## 2012-09-14 ENCOUNTER — Encounter (HOSPITAL_COMMUNITY): Admission: RE | Admit: 2012-09-14 | Payer: Self-pay | Source: Ambulatory Visit

## 2012-09-17 ENCOUNTER — Encounter (HOSPITAL_COMMUNITY)
Admission: RE | Admit: 2012-09-17 | Discharge: 2012-09-17 | Disposition: A | Discharge status: Home / Self Care | Payer: Self-pay | Source: Ambulatory Visit | Attending: Internal Medicine | Admitting: Internal Medicine

## 2012-09-17 DIAGNOSIS — J984 Other disorders of lung: Secondary | ICD-10-CM | POA: Insufficient documentation

## 2012-09-17 DIAGNOSIS — R918 Other nonspecific abnormal finding of lung field: Secondary | ICD-10-CM

## 2012-09-17 DIAGNOSIS — N200 Calculus of kidney: Secondary | ICD-10-CM | POA: Insufficient documentation

## 2012-09-17 DIAGNOSIS — R222 Localized swelling, mass and lump, trunk: Secondary | ICD-10-CM | POA: Insufficient documentation

## 2012-09-17 DIAGNOSIS — I2510 Atherosclerotic heart disease of native coronary artery without angina pectoris: Secondary | ICD-10-CM | POA: Insufficient documentation

## 2012-09-17 MED ORDER — FLUDEOXYGLUCOSE F - 18 (FDG) INJECTION
16.6000 | Freq: Once | INTRAVENOUS | Status: AC | PRN
  Administered 2012-09-17: 16.6 via INTRAVENOUS

## 2012-09-18 ENCOUNTER — Telehealth: Payer: Self-pay | Admitting: Internal Medicine

## 2012-09-18 NOTE — Telephone Encounter (Signed)
Pet scan 09/17/12 is abnromal. Please have him come in with family to discuss options. H has severe depression and psych issues

## 2012-09-20 ENCOUNTER — Telehealth: Payer: Self-pay | Admitting: Internal Medicine

## 2012-09-20 ENCOUNTER — Telehealth: Payer: Self-pay | Admitting: *Deleted

## 2012-09-20 ENCOUNTER — Ambulatory Visit: Payer: Self-pay | Admitting: Internal Medicine

## 2012-09-20 NOTE — Telephone Encounter (Signed)
[id number] x1  Dr [name] please advise if you have these results to review with the patient--pt wife is requesting these results.

## 2012-09-20 NOTE — Telephone Encounter (Signed)
Patient's wife called back and canceled the appt, per wife she talked with Dr [name] and they will schedule a biopsy next week.

## 2012-09-20 NOTE — Telephone Encounter (Signed)
I did not think that a biopsy was already done---I don't see anything in the chart. I thought that there needed to be a discussion with Dr [name] about how to proceed after the PET scan results Okay to cancel today's appt  They need to discuss options with Dr Scarlette Slice to open that slot up for someone else if needed

## 2012-09-20 NOTE — Telephone Encounter (Signed)
I called several numbers several times and had to [facility], that was 90 min ago Pleaes get hold of wife for me

## 2012-09-20 NOTE — Telephone Encounter (Signed)
Patient has appt today at 4:30 and per wife they are not coming unless we have the results from his biopsy. I see the pet scan in the chart but not the biopsy results. ( unless I don't know what I'm looking at) I advised wife we would call her back.

## 2012-09-20 NOTE — Telephone Encounter (Signed)
okay

## 2012-09-20 NOTE — Telephone Encounter (Signed)
Left message on VM, advised wife to return my call to acknowledge she received the message, advised I wouldn't cancel the appt until I spoke with her.

## 2012-09-20 NOTE — Telephone Encounter (Signed)
Called patient on mobile.Called wifeon home:. BothLMTCB. Called wife at work but she was at class. Told them to call office and get hold of me to discussPET

## 2012-09-24 NOTE — Telephone Encounter (Signed)
Duplicate message, MR spoke with pt wife. Carron Curie, CMA

## 2012-09-24 NOTE — Telephone Encounter (Signed)
Yes, arrangint EBUS. I wanted one for first week of March Please remind [name] [name]; so she dont forget. YEs, close note

## 2012-09-24 NOTE — Telephone Encounter (Signed)
MR didn't you speak with the pt wife last week? Can we close this phone note? Carron Curie, CMA

## 2012-09-24 NOTE — Telephone Encounter (Signed)
Almyra Free is working on this. Carron Curie, CMA

## 2012-09-26 ENCOUNTER — Telehealth: Payer: Self-pay | Admitting: Internal Medicine

## 2012-09-26 NOTE — Telephone Encounter (Signed)
Per Almyra Free she is waiting on the OR to open up the schedule, she states they are supposed to open it up tomorrow, and she will call the pt then with date and time. I advised the pt spouse of this. Carron Curie, CMA

## 2012-09-26 NOTE — Telephone Encounter (Signed)
Spouse calling back re: same . Has this been scheduled yet? [name] [name]

## 2012-10-02 ENCOUNTER — Encounter (HOSPITAL_COMMUNITY): Payer: Self-pay | Admitting: *Deleted

## 2012-10-02 ENCOUNTER — Encounter (HOSPITAL_COMMUNITY): Payer: Self-pay | Admitting: Pharmacy Technician

## 2012-10-02 NOTE — Progress Notes (Signed)
I spoke with patient briefly, pt was coughing frequently and said he did not know if he is suppose to have surgery tomorrow or not that I should speak with Dennie Bible.  I spoke with Dennie Bible, who is patient's estranged wife.  Dennie Bible said that the two of them do not live together, but that she will be bring patient to the hospital.  Dennie Bible said that she has not seen patient in a little over a week and that patient said that he has not been eating and she does not know if she can get him here.  I informed Dennie Bible that patient will need someone to stay with him for 24 hours after surgery, Dennie Bible said she would have to work on that.

## 2012-10-03 ENCOUNTER — Ambulatory Visit (HOSPITAL_COMMUNITY): Payer: Self-pay | Admitting: Anesthesiology

## 2012-10-03 ENCOUNTER — Encounter (HOSPITAL_COMMUNITY): Payer: Self-pay | Admitting: *Deleted

## 2012-10-03 ENCOUNTER — Ambulatory Visit (HOSPITAL_COMMUNITY): Payer: Self-pay

## 2012-10-03 ENCOUNTER — Telehealth: Payer: Self-pay | Admitting: *Deleted

## 2012-10-03 ENCOUNTER — Inpatient Hospital Stay (HOSPITAL_COMMUNITY)
Admission: RE | Admit: 2012-10-03 | Discharge: 2012-10-07 | DRG: 166 | Disposition: A | Discharge status: Home / Self Care | Payer: Self-pay | Source: Ambulatory Visit | Attending: Internal Medicine | Admitting: Internal Medicine

## 2012-10-03 ENCOUNTER — Inpatient Hospital Stay (HOSPITAL_COMMUNITY): Payer: Self-pay

## 2012-10-03 ENCOUNTER — Encounter (HOSPITAL_COMMUNITY): Payer: Self-pay | Admitting: Anesthesiology

## 2012-10-03 ENCOUNTER — Encounter (HOSPITAL_COMMUNITY)
Admission: RE | Disposition: A | Discharge status: Home / Self Care | Payer: Self-pay | Source: Ambulatory Visit | Attending: Internal Medicine

## 2012-10-03 DIAGNOSIS — R64 Cachexia: Secondary | ICD-10-CM | POA: Diagnosis present

## 2012-10-03 DIAGNOSIS — F411 Generalized anxiety disorder: Secondary | ICD-10-CM | POA: Diagnosis present

## 2012-10-03 DIAGNOSIS — D638 Anemia in other chronic diseases classified elsewhere: Secondary | ICD-10-CM | POA: Diagnosis present

## 2012-10-03 DIAGNOSIS — Q791 Other congenital malformations of diaphragm: Secondary | ICD-10-CM

## 2012-10-03 DIAGNOSIS — I959 Hypotension, unspecified: Secondary | ICD-10-CM | POA: Diagnosis not present

## 2012-10-03 DIAGNOSIS — N400 Enlarged prostate without lower urinary tract symptoms: Secondary | ICD-10-CM | POA: Diagnosis present

## 2012-10-03 DIAGNOSIS — Z515 Encounter for palliative care: Secondary | ICD-10-CM

## 2012-10-03 DIAGNOSIS — F17200 Nicotine dependence, unspecified, uncomplicated: Secondary | ICD-10-CM | POA: Diagnosis present

## 2012-10-03 DIAGNOSIS — Y838 Other surgical procedures as the cause of abnormal reaction of the patient, or of later complication, without mention of misadventure at the time of the procedure: Secondary | ICD-10-CM | POA: Diagnosis present

## 2012-10-03 DIAGNOSIS — Z8673 Personal history of transient ischemic attack (TIA), and cerebral infarction without residual deficits: Secondary | ICD-10-CM

## 2012-10-03 DIAGNOSIS — I2129 ST elevation (STEMI) myocardial infarction involving other sites: Secondary | ICD-10-CM

## 2012-10-03 DIAGNOSIS — C61 Malignant neoplasm of prostate: Secondary | ICD-10-CM

## 2012-10-03 DIAGNOSIS — R000 Tachycardia, unspecified: Secondary | ICD-10-CM

## 2012-10-03 DIAGNOSIS — Z8546 Personal history of malignant neoplasm of prostate: Secondary | ICD-10-CM

## 2012-10-03 DIAGNOSIS — J95821 Acute postprocedural respiratory failure: Principal | ICD-10-CM | POA: Diagnosis present

## 2012-10-03 DIAGNOSIS — J9819 Other pulmonary collapse: Secondary | ICD-10-CM | POA: Diagnosis present

## 2012-10-03 DIAGNOSIS — Z87442 Personal history of urinary calculi: Secondary | ICD-10-CM

## 2012-10-03 DIAGNOSIS — L570 Actinic keratosis: Secondary | ICD-10-CM

## 2012-10-03 DIAGNOSIS — E785 Hyperlipidemia, unspecified: Secondary | ICD-10-CM | POA: Diagnosis present

## 2012-10-03 DIAGNOSIS — R918 Other nonspecific abnormal finding of lung field: Secondary | ICD-10-CM

## 2012-10-03 DIAGNOSIS — I214 Non-ST elevation (NSTEMI) myocardial infarction: Secondary | ICD-10-CM | POA: Diagnosis not present

## 2012-10-03 DIAGNOSIS — J988 Other specified respiratory disorders: Secondary | ICD-10-CM | POA: Diagnosis present

## 2012-10-03 DIAGNOSIS — F329 Major depressive disorder, single episode, unspecified: Secondary | ICD-10-CM

## 2012-10-03 DIAGNOSIS — I10 Essential (primary) hypertension: Secondary | ICD-10-CM

## 2012-10-03 DIAGNOSIS — J449 Chronic obstructive pulmonary disease, unspecified: Secondary | ICD-10-CM

## 2012-10-03 DIAGNOSIS — I255 Ischemic cardiomyopathy: Secondary | ICD-10-CM

## 2012-10-03 DIAGNOSIS — I4891 Unspecified atrial fibrillation: Secondary | ICD-10-CM

## 2012-10-03 DIAGNOSIS — J4489 Other specified chronic obstructive pulmonary disease: Secondary | ICD-10-CM | POA: Diagnosis present

## 2012-10-03 DIAGNOSIS — J9620 Acute and chronic respiratory failure, unspecified whether with hypoxia or hypercapnia: Secondary | ICD-10-CM | POA: Diagnosis present

## 2012-10-03 DIAGNOSIS — C3400 Malignant neoplasm of unspecified main bronchus: Secondary | ICD-10-CM | POA: Diagnosis present

## 2012-10-03 DIAGNOSIS — F322 Major depressive disorder, single episode, severe without psychotic features: Secondary | ICD-10-CM | POA: Diagnosis present

## 2012-10-03 DIAGNOSIS — C3490 Malignant neoplasm of unspecified part of unspecified bronchus or lung: Secondary | ICD-10-CM | POA: Diagnosis present

## 2012-10-03 DIAGNOSIS — I69959 Hemiplegia and hemiparesis following unspecified cerebrovascular disease affecting unspecified side: Secondary | ICD-10-CM

## 2012-10-03 DIAGNOSIS — N200 Calculus of kidney: Secondary | ICD-10-CM

## 2012-10-03 DIAGNOSIS — Y9210 Unspecified residential institution as the place of occurrence of the external cause: Secondary | ICD-10-CM | POA: Diagnosis present

## 2012-10-03 HISTORY — PX: VIDEO BRONCHOSCOPY WITH ENDOBRONCHIAL ULTRASOUND: SHX6177

## 2012-10-03 HISTORY — DX: Shortness of breath: R06.02

## 2012-10-03 LAB — BLOOD GAS, ARTERIAL
Bicarbonate: 23 mEq/L (ref 20.0–24.0)
Drawn by: 24486
FIO2: 0.6 %
MECHVT: 450 mL
PEEP: 5 cmH2O
Patient temperature: 98.6
TCO2: 24.3 mmol/L (ref 0–100)
pCO2 arterial: 74.7 mmHg (ref 35.0–45.0)
pCO2 arterial: 40.5 mmHg (ref 35.0–45.0)
pH, Arterial: 7.163 — CL (ref 7.350–7.450)
pH, Arterial: 7.367 (ref 7.350–7.450)
pO2, Arterial: 137 mmHg — ABNORMAL HIGH (ref 80.0–100.0)

## 2012-10-03 LAB — COMPREHENSIVE METABOLIC PANEL
ALT: 6 U/L (ref 0–53)
AST: 13 U/L (ref 0–37)
Albumin: 2.7 g/dL — ABNORMAL LOW (ref 3.5–5.2)
Albumin: 2.6 g/dL — ABNORMAL LOW (ref 3.5–5.2)
BUN: 6 mg/dL (ref 6–23)
CO2: 24 mEq/L (ref 19–32)
Calcium: 9.6 mg/dL (ref 8.4–10.5)
Calcium: 9.3 mg/dL (ref 8.4–10.5)
Chloride: 96 mEq/L (ref 96–112)
Creatinine, Ser: 0.81 mg/dL (ref 0.50–1.35)
GFR calc Af Amer: >90 mL/min (ref >90)
GFR calc non Af Amer: >90 mL/min (ref >90)
Glucose, Bld: 123 mg/dL — ABNORMAL HIGH (ref 70–99)
Sodium: 134 mEq/L — ABNORMAL LOW (ref 135–145)
Total Bilirubin: 0.3 mg/dL (ref 0.3–1.2)
Total Protein: 7.1 g/dL (ref 6.0–8.3)

## 2012-10-03 LAB — APTT: aPTT: 47 seconds — ABNORMAL HIGH (ref 24–37)

## 2012-10-03 LAB — CBC
HCT: 35.3 % — ABNORMAL LOW (ref 39.0–52.0)
HCT: 34.5 % — ABNORMAL LOW (ref 39.0–52.0)
Hemoglobin: 11.7 g/dL — ABNORMAL LOW (ref 13.0–17.0)
Hemoglobin: 11.1 g/dL — ABNORMAL LOW (ref 13.0–17.0)
MCV: 83.6 fL (ref 78.0–100.0)
MCV: 85.6 fL (ref 78.0–100.0)
RBC: 4.22 MIL/uL (ref 4.22–5.81)
WBC: 6.9 10*3/uL (ref 4.0–10.5)
WBC: 6.6 10*3/uL (ref 4.0–10.5)

## 2012-10-03 LAB — GLUCOSE, CAPILLARY: Glucose-Capillary: 126 mg/dL — ABNORMAL HIGH (ref 70–99)

## 2012-10-03 LAB — SODIUM, URINE, RANDOM: Sodium, Ur: 30 mEq/L

## 2012-10-03 LAB — PROTIME-INR: INR: 1.13 (ref 0.00–1.49)

## 2012-10-03 LAB — PHOSPHORUS: Phosphorus: 5.7 mg/dL — ABNORMAL HIGH (ref 2.3–4.6)

## 2012-10-03 LAB — CREATININE, URINE, RANDOM: Creatinine, Urine: 180.13 mg/dL

## 2012-10-03 LAB — MRSA PCR SCREENING: MRSA by PCR: NEGATIVE

## 2012-10-03 SURGERY — BRONCHOSCOPY, WITH EBUS
Anesthesia: General | Site: Chest | Wound class: Clean Contaminated

## 2012-10-03 MED ORDER — MIDAZOLAM HCL 5 MG/5ML IJ SOLN
INTRAMUSCULAR | Status: DC | PRN
  Administered 2012-10-03: 2 mg via INTRAVENOUS

## 2012-10-03 MED ORDER — HYDROMORPHONE HCL PF 1 MG/ML IJ SOLN
0.2500 mg | INTRAMUSCULAR | Status: DC | PRN
  Administered 2012-10-03: 0.5 mg via INTRAVENOUS

## 2012-10-03 MED ORDER — HYDROMORPHONE HCL PF 1 MG/ML IJ SOLN
INTRAMUSCULAR | Status: AC
  Filled 2012-10-03: qty 1

## 2012-10-03 MED ORDER — SUCCINYLCHOLINE CHLORIDE 20 MG/ML IJ SOLN
INTRAMUSCULAR | Status: DC | PRN
  Administered 2012-10-03: 100 mg via INTRAVENOUS

## 2012-10-03 MED ORDER — MAGNESIUM SULFATE 40 MG/ML IJ SOLN
2.0000 g | Freq: Once | INTRAMUSCULAR | Status: AC
  Administered 2012-10-03: 2 g via INTRAVENOUS
  Filled 2012-10-03: qty 50

## 2012-10-03 MED ORDER — GLYCOPYRROLATE 0.2 MG/ML IJ SOLN
INTRAMUSCULAR | Status: DC | PRN
  Administered 2012-10-03: 0.6 mg via INTRAVENOUS

## 2012-10-03 MED ORDER — ASPIRIN 300 MG RE SUPP
300.0000 mg | RECTAL | Status: AC
  Filled 2012-10-03: qty 1

## 2012-10-03 MED ORDER — IPRATROPIUM BROMIDE 0.02 % IN SOLN
0.5000 mg | RESPIRATORY_TRACT | Status: DC
  Administered 2012-10-03 – 2012-10-04 (×7): 0.5 mg via RESPIRATORY_TRACT
  Filled 2012-10-03 (×6): qty 2.5

## 2012-10-03 MED ORDER — EPINEPHRINE HCL 1 MG/ML IJ SOLN
INTRAMUSCULAR | Status: DC | PRN
  Administered 2012-10-03: 1 mL via ENDOTRACHEOPULMONARY

## 2012-10-03 MED ORDER — PROPOFOL INFUSION 10 MG/ML OPTIME
INTRAVENOUS | Status: DC | PRN
  Administered 2012-10-03: 50 ug/kg/min via INTRAVENOUS

## 2012-10-03 MED ORDER — ALBUTEROL SULFATE (5 MG/ML) 0.5% IN NEBU
2.5000 mg | INHALATION_SOLUTION | RESPIRATORY_TRACT | Status: DC
  Administered 2012-10-03 – 2012-10-04 (×14): 2.5 mg via RESPIRATORY_TRACT
  Filled 2012-10-03 (×12): qty 0.5

## 2012-10-03 MED ORDER — OXYCODONE HCL 5 MG/5ML PO SOLN: 5.0000 mg | Freq: Once | ORAL | Status: DC | PRN

## 2012-10-03 MED ORDER — NEOSTIGMINE METHYLSULFATE 1 MG/ML IJ SOLN
INTRAMUSCULAR | Status: DC | PRN
  Administered 2012-10-03: 4 mg via INTRAVENOUS

## 2012-10-03 MED ORDER — METHYLPREDNISOLONE SODIUM SUCC 125 MG IJ SOLR
60.0000 mg | Freq: Four times a day (QID) | INTRAMUSCULAR | Status: DC
  Administered 2012-10-03 – 2012-10-05 (×7): 60 mg via INTRAVENOUS
  Filled 2012-10-03 (×11): qty 0.96

## 2012-10-03 MED ORDER — PROPOFOL 10 MG/ML IV EMUL
INTRAVENOUS | Status: AC
  Filled 2012-10-03: qty 100

## 2012-10-03 MED ORDER — PANTOPRAZOLE SODIUM 40 MG IV SOLR
40.0000 mg | Freq: Every day | INTRAVENOUS | Status: DC
  Administered 2012-10-03: 40 mg via INTRAVENOUS
  Filled 2012-10-03 (×2): qty 40

## 2012-10-03 MED ORDER — DEXTROSE-NACL 5-0.45 % IV SOLN
INTRAVENOUS | Status: DC
  Administered 2012-10-03 – 2012-10-04 (×2): via INTRAVENOUS

## 2012-10-03 MED ORDER — EPINEPHRINE HCL 1 MG/ML IJ SOLN
INTRAMUSCULAR | Status: AC
  Filled 2012-10-03: qty 1

## 2012-10-03 MED ORDER — ONDANSETRON HCL 4 MG/2ML IJ SOLN
INTRAMUSCULAR | Status: DC | PRN
  Administered 2012-10-03: 4 mg via INTRAVENOUS

## 2012-10-03 MED ORDER — TRAZODONE HCL 50 MG PO TABS
50.0000 mg | ORAL_TABLET | Freq: Every day | ORAL | Status: DC
  Administered 2012-10-03 – 2012-10-06 (×4): 50 mg via ORAL
  Filled 2012-10-03 (×5): qty 1

## 2012-10-03 MED ORDER — LACTATED RINGERS IV SOLN
INTRAVENOUS | Status: DC
  Administered 2012-10-03: 12:00:00 via INTRAVENOUS

## 2012-10-03 MED ORDER — ALBUTEROL SULFATE (5 MG/ML) 0.5% IN NEBU
INHALATION_SOLUTION | RESPIRATORY_TRACT | Status: AC
  Filled 2012-10-03: qty 0.5

## 2012-10-03 MED ORDER — LACTATED RINGERS IV SOLN
INTRAVENOUS | Status: DC | PRN
  Administered 2012-10-03: 13:00:00 via INTRAVENOUS

## 2012-10-03 MED ORDER — FENTANYL CITRATE 0.05 MG/ML IJ SOLN: 50.0000 ug | INTRAMUSCULAR | Status: DC | PRN

## 2012-10-03 MED ORDER — PROMETHAZINE HCL 25 MG/ML IJ SOLN: 6.2500 mg | INTRAMUSCULAR | Status: DC | PRN

## 2012-10-03 MED ORDER — PROPOFOL 10 MG/ML IV EMUL
5.0000 ug/kg/min | INTRAVENOUS | Status: AC
  Filled 2012-10-03: qty 100

## 2012-10-03 MED ORDER — LIDOCAINE HCL (CARDIAC) 20 MG/ML IV SOLN
INTRAVENOUS | Status: DC | PRN
  Administered 2012-10-03: 80 mg via INTRAVENOUS

## 2012-10-03 MED ORDER — PROPOFOL 10 MG/ML IV EMUL
5.0000 ug/kg/min | INTRAVENOUS | Status: DC
  Administered 2012-10-03 – 2012-10-04 (×2): 50 ug/kg/min via INTRAVENOUS
  Filled 2012-10-03 (×2): qty 100

## 2012-10-03 MED ORDER — PROPOFOL 10 MG/ML IV BOLUS
INTRAVENOUS | Status: DC | PRN
  Administered 2012-10-03: 50 mg via INTRAVENOUS
  Administered 2012-10-03: 150 mg via INTRAVENOUS

## 2012-10-03 MED ORDER — IPRATROPIUM BROMIDE 0.02 % IN SOLN
RESPIRATORY_TRACT | Status: AC
  Filled 2012-10-03: qty 2.5

## 2012-10-03 MED ORDER — SODIUM CHLORIDE 0.9 % IV SOLN: 250.0000 mL | INTRAVENOUS | Status: DC | PRN

## 2012-10-03 MED ORDER — OXYCODONE HCL 5 MG PO TABS: 5.0000 mg | ORAL_TABLET | Freq: Once | ORAL | Status: DC | PRN

## 2012-10-03 MED ORDER — ALBUTEROL SULFATE (5 MG/ML) 0.5% IN NEBU
INHALATION_SOLUTION | RESPIRATORY_TRACT | Status: AC
  Administered 2012-10-03: 2.5 mg
  Filled 2012-10-03: qty 0.5

## 2012-10-03 MED ORDER — ARIPIPRAZOLE 10 MG PO TABS
10.0000 mg | ORAL_TABLET | Freq: Every day | ORAL | Status: DC
Start: 2012-10-03 — End: 2012-10-07
  Administered 2012-10-03 – 2012-10-07 (×5): 10 mg via ORAL
  Filled 2012-10-03 (×5): qty 1

## 2012-10-03 MED ORDER — ASPIRIN 81 MG PO CHEW: 324.0000 mg | CHEWABLE_TABLET | ORAL | Status: AC

## 2012-10-03 MED ORDER — FENTANYL CITRATE 0.05 MG/ML IJ SOLN
INTRAMUSCULAR | Status: DC | PRN
  Administered 2012-10-03: 100 ug via INTRAVENOUS

## 2012-10-03 MED ORDER — ROCURONIUM BROMIDE 100 MG/10ML IV SOLN
INTRAVENOUS | Status: DC | PRN
  Administered 2012-10-03: 40 mg via INTRAVENOUS
  Administered 2012-10-03: 50 mg via INTRAVENOUS

## 2012-10-03 MED ORDER — 0.9 % SODIUM CHLORIDE (POUR BTL) OPTIME
TOPICAL | Status: DC | PRN
  Administered 2012-10-03: 1000 mL

## 2012-10-03 MED ORDER — MIRTAZAPINE 45 MG PO TABS
45.0000 mg | ORAL_TABLET | Freq: Every day | ORAL | Status: DC
  Administered 2012-10-03 – 2012-10-06 (×4): 45 mg via ORAL
  Filled 2012-10-03 (×5): qty 1

## 2012-10-03 SURGICAL SUPPLY — 22 items
BRUSH CYTOL CELLEBRITY 1.5X140 (MISCELLANEOUS) IMPLANT
CANISTER SUCTION 2500CC (MISCELLANEOUS) ×3 IMPLANT
CLOTH BEACON ORANGE TIMEOUT ST (SAFETY) ×3 IMPLANT
CONT SPEC 4OZ CLIKSEAL STRL BL (MISCELLANEOUS) ×3 IMPLANT
COVER TABLE BACK 60X90 (DRAPES) ×3 IMPLANT
FORCEPS BIOP RJ4 1.8 (CUTTING FORCEPS) ×2 IMPLANT
GLOVE BIOGEL M STRL SZ7.5 (GLOVE) ×3 IMPLANT
KIT ROOM TURNOVER OR (KITS) ×3 IMPLANT
MARKER SKIN DUAL TIP RULER LAB (MISCELLANEOUS) ×3 IMPLANT
NDL BIOPSY TRANSBRONCH 21G (NEEDLE) IMPLANT
NEEDLE BIOPSY TRANSBRONCH 21G (NEEDLE) IMPLANT
NEEDLE SYS SONOTIP II EBUSTBNA (NEEDLE) ×2 IMPLANT
NS IRRIG 1000ML POUR BTL (IV SOLUTION) ×3 IMPLANT
OIL SILICONE PENTAX (PARTS (SERVICE/REPAIRS)) ×2 IMPLANT
PAD ARMBOARD 7.5X6 YLW CONV (MISCELLANEOUS) ×6 IMPLANT
SPONGE GAUZE 4X4 12PLY (GAUZE/BANDAGES/DRESSINGS) ×2 IMPLANT
SYR 20CC LL (SYRINGE) ×3 IMPLANT
SYR 20ML ECCENTRIC (SYRINGE) ×3 IMPLANT
SYR 5ML LUER SLIP (SYRINGE) ×3 IMPLANT
TOWEL OR 17X24 6PK STRL BLUE (TOWEL DISPOSABLE) ×1 IMPLANT
TRAP SPECIMEN MUCOUS 40CC (MISCELLANEOUS) ×3 IMPLANT
TUBE CONNECTING 12X1/4 (SUCTIONS) ×3 IMPLANT

## 2012-10-03 NOTE — Interval H&P Note (Signed)
Also  Dg Chest 2 View  10/03/2012  *RADIOLOGY REPORT*  Clinical Data: Preop bronchoscopy/EBUS  CHEST - 2 VIEW  Comparison: PET CT dated 09/17/2012  Findings: Opacity in the right perihilar/infrahilar region, related to known mass and postobstructive opacity.  Left lung essentially clear. No pleural effusion or pneumothorax.  The heart is normal in size.  Mild degenerative changes of the visualized thoracolumbar spine.  IMPRESSION: Opacity in the right perihilar/infrahilar region, related to known mass and postobstructive opacity.   Original Report Authenticated By: Charline Bills, M.D.      Recent Labs Lab 10/03/12 1045  HGB 11.7*  HCT 35.3*  WBC 6.9  PLT 325    Recent Labs Lab 10/03/12 1045  NA 135  K 3.4*  CL 97  CO2 24  GLUCOSE 123*  BUN 6  CREATININE 0.84  CALCIUM 9.6   EKG Feb 2014 - sinus without acute changes   Dr. Kalman Shan, M.D., [facility].C.P Pulmonary and Critical Care Medicine Staff Physician [facility] Pager: [phone], If no answer or between  15:00h - 7:00h: call [phone]  10/03/2012 12:34 PM

## 2012-10-03 NOTE — Procedures (Signed)
Arterial Catheter Insertion Procedure Note Benedict Kue [id number] March 01, 1956  Procedure: Insertion of Arterial Catheter  Indications: Blood pressure monitoring  Procedure Details Consent: Unable to obtain consent because of emergent medical necessity. Time Out: Verified patient identification, verified procedure, site/side was marked, verified correct patient position, special equipment/implants available, medications/allergies/relevent history reviewed, required imaging and test results available.  Performed  Maximum sterile technique was used including cap, gloves, gown, hand hygiene, mask and sheet. Skin prep: Chlorhexidine; local anesthetic administered 20 gauge catheter was inserted into left radial artery using the Seldinger technique.  Evaluation Blood flow good; BP tracing good. Complications: No apparent complications. RT placed arrow catheter on first a attempt   Morley Kos 10/03/2012

## 2012-10-03 NOTE — Transfer of Care (Signed)
Immediate Anesthesia Transfer of Care Note  Patient: [name] [name]  Procedure(s) Performed: Procedure(s): VIDEO BRONCHOSCOPY WITH ENDOBRONCHIAL ULTRASOUND (N/A)  Patient Location: PACU  Anesthesia Type:General  Level of Consciousness: awake and alert   Airway & Oxygen Therapy: Patient Spontanous Breathing, Patient re-intubated and Patient placed on Ventilator (see vital sign flow sheet for setting)  Post-op Assessment: Report given to PACU RN, Post -op Vital signs reviewed and unstable, Anesthesiologist notified and Patient moving all extremities  Post vital signs: Reviewed and stable  Complications: Patient re-intubated

## 2012-10-03 NOTE — H&P (View-Only) (Signed)
Subjective:    Patient ID: [name] [name], male    DOB: 02/12/1956, 56 y.o.   MRN: [id number]  HPI  [name] Letvak, MD is PCP  Body mass index is 20.94 kg/(m^2).   reports that he has been smoking Cigarettes.  He has a 42 pack-year smoking history. He does not have any smokeless tobacco history on file.   IOV 09/05/2012  History is provided by his wife mainly and some by him  [name]. 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 In 2010 he was diagnosed with prostate cancer and subsequent to this his depression worsened and 2011 and a recurrence of prostate cancer and underwent radiation therapy. But during all this his depression was so bad that he lost his business and was emotionally and physically but not legally separated from his wife and and started living alone. Then since 2011 wife started noticing progressive depression and weight loss and self neglect that during all these years he started refusing medical treatment and doctor visits. Then in November 2013 he abruptly became more optimistic and wanted to see a physician but was diagnosed with pneumonia this time based on a chest x-ray and given antibiotics.  Than 08/08/2012 he was [facility] and [facility]. The course at the [facility] was characterized by one of noncompliance but slow improvement to the point that he was discharged on 09/01/2012. During this time he had a CT scan of the chest January 20 oh 2014 that showed right lower lobe and right middle lobe bronchial obstruction with distal infiltrates very suggestive of lung cancer. Size of the mass was 1.3 cm on image 50. There was thought to be some right hilar adenopathy but this was not well visualized on the noncontrast CT. I personally confirmed these findings. Apparently he refuse lung biopsy at this time.  However post discharge  from behavioral health 4 days ago he is now express an interest to know his diagnosis and therefore he has been referred here. He says that he would like to know his diagnosis but is not certain whether he would want to undergo a lung cancer treatment. According to his wife he might change his mind but this would be unlikely.  Again they're very concerned about insurance status.  In terms of his symptoms he is in chronic cough with phlegm. Wife has noticed hemoptysis a few times recently the past few weeks mild amounts   Spirometry FEV1 1.6 L 44% with ratio 73 Past Medical History  Diagnosis Date  . Prostate cancer 10/2005    Dr. Cope ([phone])  . Impaired glucose tolerance   . HLD (hyperlipidemia)   . HTN (hypertension) 1992  . BPH (benign prostatic hypertrophy)   . History of kidney stones   . Anxiety   . CVA (cerebral infarction) 07/2009  . Depression      Family History  Problem Relation Age of Onset  . Coronary artery disease Father   . Brain cancer Father     tumor  . Hypertension Mother   . Diabetes Mother     early  . Dementia Mother      History   Social History  . Marital Status: Married    Spouse Name: N/A    Number of Children: 2  . Years of Education: N/A   Occupational History  . [employer]     Social History Main Topics  . Smoking status: Current Every Day Smoker -- 1.0 packs/day for 42 years    Types: Cigarettes  . Smokeless tobacco: Not on file  . Alcohol Use: Yes  . Drug Use: Not on file  . Sexually Active: Not on file   Other Topics Concern  . Not on file   Social History Narrative  . No narrative on file     Allergies  Allergen Reactions  . Erythromycin Ethylsuccinate     REACTION: unspecified  . Sildenafil     REACTION: just didn't tolerate     Outpatient Prescriptions Prior to Visit  Medication Sig Dispense Refill  . albuterol-ipratropium (COMBIVENT) 18-103 MCG/ACT inhaler Inhale 1 puff into the  lungs 4 (four) times daily.      . ARIPiprazole (ABILIFY) 10 MG tablet Take 10 mg by mouth daily.      . mirtazapine (REMERON) 45 MG tablet Take 45 mg by mouth at bedtime.      . traZODone (DESYREL) 50 MG tablet Take 50 mg by mouth at bedtime.       Last reviewed on 09/04/2012 12:01 PM by [name] I Letvak, MD   > Review of Systems  Constitutional: Positive for appetite change and unexpected weight change. Negative for fever.  HENT: Positive for congestion. Negative for ear pain, nosebleeds, sore throat, rhinorrhea, sneezing, trouble swallowing, dental problem, postnasal drip and sinus pressure.   Eyes: Negative for redness and itching.  Respiratory: Positive for cough and shortness of breath. Negative for chest tightness and wheezing.   Cardiovascular: Negative for palpitations and leg swelling.  Gastrointestinal: Negative for nausea and vomiting.  Genitourinary: Negative for dysuria.  Musculoskeletal: Negative for joint swelling.  Skin: Negative for rash.  Neurological: Negative for headaches.  Hematological: Does not bruise/bleed easily.  Psychiatric/Behavioral: Positive for dysphoric mood. The patient is nervous/anxious.        Objective:   Physical Exam  Nursing note and vitals reviewed. Constitutional: He is oriented to person, place, and time. He appears well-developed and well-nourished. No distress.       Disheveled Body mass index is 20.94 kg/(m^2).   HENT:  Head: Normocephalic and atraumatic.  Right Ear: External ear normal.  Left Ear: External ear normal.  Mouth/Throat: Oropharynx is clear and moist. No oropharyngeal exudate.  Eyes: Conjunctivae normal and EOM are normal. Pupils are equal, round, and reactive to light. Right eye exhibits no discharge. Left eye exhibits no discharge. No scleral icterus.  Neck: Normal range of motion. Neck supple. No JVD present. No tracheal deviation present. No thyromegaly present.  Cardiovascular: Normal rate, regular rhythm and  intact distal pulses.  Exam reveals no gallop and no friction rub.   No murmur heard. Pulmonary/Chest: Effort normal and breath sounds normal. No respiratory distress. He has no wheezes. He has no rales. He exhibits no tenderness.       Coughs periodically coarse breath sounds  Abdominal: Soft. Bowel sounds are normal. He exhibits no distension and no mass. There is no tenderness. There is no rebound and no guarding.  Musculoskeletal: Normal range of motion. He exhibits no edema and no tenderness.  Lymphadenopathy:    He has no cervical adenopathy.  Neurological: He is alert and oriented to person, place, and time. He has normal reflexes. No cranial nerve deficit. Coordination normal.  Skin: Skin is warm and dry. No rash noted. He is not diaphoretic. No erythema. No pallor.  Psychiatric:       Depressed and flat   affect          Assessment & Plan:  \ 

## 2012-10-03 NOTE — Preoperative (Signed)
Beta Blockers   Reason not to administer Beta Blockers:Not Applicable 

## 2012-10-03 NOTE — Anesthesia Postprocedure Evaluation (Signed)
  Anesthesia Post-op Note  Patient: [name] [name]  Procedure(s) Performed: Procedure(s): VIDEO BRONCHOSCOPY WITH ENDOBRONCHIAL ULTRASOUND (N/A)  Patient Location: PACU  Anesthesia Type:General  Level of Consciousness: sedated and unresponsive  Airway and Oxygen Therapy: Patient re-intubated and Patient placed on Ventilator (see vital sign flow sheet for setting)  Post-op Pain: none  Post-op Assessment: Post-op Vital signs reviewed, Patient's Cardiovascular Status Stable and RESPIRATORY FUNCTION UNSTABLE  Post-op Vital Signs: Reviewed and stable  Complications: Patient re-intubated and respiratory complications

## 2012-10-03 NOTE — Op Note (Addendum)
Name:  [name] [name] MRN:  [id number] DOB:  11/14/1955  PROCEDURE NOTE  Procedure(s): Flexible bronchoscopy [phone]) Endobronchial biopsy (72536) of the carinal mass Endobronchial ultrasound (64403) Transbronchial needle aspiration (47425) of the the subcarina station 7 mass  Indications:  Hilar / mediastinal lymphadenopathy.and lung mass and a smoker with COPD  Consent:  Procedure, benefits, risks and alternatives discussed.  Questions answered.  Consent obtained.  Anesthesia:  General endotracheal.  Procedure summary:  Appropriate equipment was assembled.  The patient was brought to the operating room and identified as [name] [name].  Safety timeout was performed. The patient was placed supine on the operating table, airway established and general anesthesia administered by Anesthesia team.   After the appropriate level of anesthesia was assured, flexible video bronchoscope was lubricated and inserted through the endotracheal tube.     Airway examination was performed bilaterally to subsegmental level.  The main finding was that large polypoid friable carinal mass that was straddling older right main and left main bronchus. On the right side there was a tendency to nearly occlude the righ main bronchus but I was able to negotiate flexible scope easily past the lesions do a detailed exam which revealed normal subsegmental bronchus. On the left side polypoid lesion only seemed to block the left main bronchus 25%. Distal examination for normal subsegmental bronchus.  The flexible bronchoscope was withdrawn and the Endobronchial ultrasound video bronchoscope was then lubricated and inserted through the endotracheal tube.   Endobronchial ultrasound guided transbronchial needle aspiration of station 7 sub carinal mass (passes x2 ) phone and sent for cytology. This was later reported as consistent with non-small cell lung cancer. While waiting for these results more passes at the same site  was performed and samples placed in cytoline. After this EBUS bronchoscope was withdrawn.  Flexible video bronchoscope was used again to perform random endobronchial mucosal biopsies x 5 endobronchial mass.    After hemostasis was assure, the bronchoscope was withdrawn.of note, topical epinephrine was applied because of the friable nature of the lesion  The patient was extubated in operating room and transferred to PACU.  Post-procedure chest x-ray was  ordered.  Impression  - NON SMALL CELL LUNG CANCER  Specimens sent: Transbronchial needle aspiration under endobronchial ultrasound x 4 Endobronchial biopsies x5 Both of carinal mass   Complications:  No immediate complications were noted.  Hemodynamic parameters and oxygenation remained stable throughout the procedure.   Estimated blood loss:  Less then 2 mL.  Post procedure plan    - Wife expressing significant concern patient being alone at home and she will not stay with him  Or she can take him to her home. Also, post extubation in PACU patient had significant respiratory distress and needed reintubation. So, will admit to ICU   Dr. Kalman Shan, M.D., [facility].C.P Pulmonary and Critical Care Medicine Staff Physician [facility] Pulmonary and Critical Care Pager: [phone], If no answer or between  15:00h - 7:00h: call [phone]  10/03/2012 2:38 PM

## 2012-10-03 NOTE — Anesthesia Preprocedure Evaluation (Signed)
Anesthesia Evaluation  Patient identified by MRN, date of birth, ID band Patient awake    Reviewed: Allergy & Precautions, H&P , NPO status , Patient's Chart, lab work & pertinent test results  History of Anesthesia Complications Negative for: history of anesthetic complications  Airway Mallampati: II TM Distance: >3 FB Neck ROM: Full    Dental  (+) Poor Dentition and Dental Advisory Given   Pulmonary shortness of breath, COPD COPD inhaler, Current Smoker,  + rhonchi   + wheezing      Cardiovascular hypertension,     Neuro/Psych PSYCHIATRIC DISORDERS Anxiety Depression CVA    GI/Hepatic negative GI ROS, Neg liver ROS,   Endo/Other  negative endocrine ROS  Renal/GU negative Renal ROS     Musculoskeletal   Abdominal   Peds  Hematology negative hematology ROS (+)   Anesthesia Other Findings   Reproductive/Obstetrics                           Anesthesia Physical Anesthesia Plan  ASA: III  Anesthesia Plan: General   Post-op Pain Management:    Induction: Intravenous  Airway Management Planned: Oral ETT  Additional Equipment:   Intra-op Plan:   Post-operative Plan: Possible Post-op intubation/ventilation  Informed Consent: I have reviewed the patients History and Physical, chart, labs and discussed the procedure including the risks, benefits and alternatives for the proposed anesthesia with the patient or authorized representative who has indicated his/her understanding and acceptance.   Dental advisory given  Plan Discussed with: CRNA, Anesthesiologist and Surgeon  Anesthesia Plan Comments:         Anesthesia Quick Evaluation

## 2012-10-03 NOTE — Progress Notes (Signed)
Called to bedside in PACU with pt. In respiratory distress. O2 sats in the 70's with very little air movement. + wheezing. Albuterol tx given without improvement. No improvement with 100% non-rebreather face mask. Pt intubated with 8.0ETT following induction with 50mg  of Propofol and 80mg  Sux. BS=B. +EtCO2. CXR pending. Will go to ICU.

## 2012-10-03 NOTE — Interval H&P Note (Signed)
Patient now has had PET scan and shows RT sided PET Hot mass 09/17/12 as below. This was discussed with him and wife and they have consented for flexxible video bronch, biopsy, EBUS. He has capacity but due to severe depression wife helps with decisions and care coordinations  Risks of pneumothorax, hemothorax, sedation/anesthesia complications such as cardiac or respiratory arrest or hypotension, stroke and bleeding all explained. Benefits of diagnosis but limitations of non-diagnosis also explained. Patient and wife verbalized understanding and wished to proceed.    No interim issues   IMPRESSION:  1. Right hilar/subcarinal mass obstructs the bronchus intermedius  and extends across the midline into the left mainstem bronchus.  Associated postobstructive consolidation and nodularity in the  right middle lobe and right lower lobe. Hypermetabolic mediastinal  adenopathy extends to the prevascular space. Findings are most  consistent with primary bronchogenic carcinoma.  2. Focal hypermetabolism within a nodular density in the right  lower lobe may be post obstructive in etiology. Difficult to  exclude a primary lesion if histology is consistent with small  cell carcinoma.  3. No evidence of distant metastatic disease.  4. Punctate right renal stone.  Original Report Authenticated By: Leanna Battles, M.D.

## 2012-10-03 NOTE — Progress Notes (Signed)
Foley insertion attempted. Unsuccessful ,. Met resistance. Condom cath placed no urine output at present. Dr Vassie Loll made aware. Will monitor output

## 2012-10-03 NOTE — Telephone Encounter (Signed)
Spoke with wife regarding appt for mtoc 10/11/12 at 3:45.  She verbalized understanding of time and place of appt

## 2012-10-03 NOTE — Progress Notes (Signed)
Post op  - in PACU it was noticed patient had failed extubation. Very tight air entry with tight air moement, paradoxical respirtion,. desat to 60s and diaphoretic  pDDX  - post op anesth retention  - RT lung collapse due to blood clot plug post bx  Plan  - intubation by anesthesia  - ICU admission, 3100 ICU bed - PRVC FOR PERMISSIVE HYPERCAPNIA - CHECK LABS  Wife updated    Dr. Kalman Shan, M.D., [facility].C.P Pulmonary and Critical Care Medicine Staff Physician [facility] Pulmonary and Critical Care Pager: [phone], If no answer or between  15:00h - 7:00h: call [phone]  10/03/2012 3:01 PM

## 2012-10-04 ENCOUNTER — Encounter (HOSPITAL_COMMUNITY): Payer: Self-pay | Admitting: Internal Medicine

## 2012-10-04 DIAGNOSIS — J9620 Acute and chronic respiratory failure, unspecified whether with hypoxia or hypercapnia: Secondary | ICD-10-CM | POA: Diagnosis present

## 2012-10-04 DIAGNOSIS — F329 Major depressive disorder, single episode, unspecified: Secondary | ICD-10-CM

## 2012-10-04 DIAGNOSIS — R222 Localized swelling, mass and lump, trunk: Secondary | ICD-10-CM

## 2012-10-04 DIAGNOSIS — R599 Enlarged lymph nodes, unspecified: Secondary | ICD-10-CM

## 2012-10-04 DIAGNOSIS — J449 Chronic obstructive pulmonary disease, unspecified: Secondary | ICD-10-CM

## 2012-10-04 LAB — BASIC METABOLIC PANEL
CO2: 21 mEq/L (ref 19–32)
Calcium: 9.2 mg/dL (ref 8.4–10.5)
Creatinine, Ser: 0.78 mg/dL (ref 0.50–1.35)

## 2012-10-04 LAB — CBC
MCV: 82 fL (ref 78.0–100.0)
Platelets: 254 10*3/uL (ref 150–400)
RDW: 15.3 % (ref 11.5–15.5)
WBC: 8.2 10*3/uL (ref 4.0–10.5)

## 2012-10-04 MED ORDER — FENTANYL CITRATE 0.05 MG/ML IJ SOLN: 25.0000 ug | INTRAMUSCULAR | Status: DC | PRN

## 2012-10-04 MED ORDER — BIOTENE DRY MOUTH MT LIQD
15.0000 mL | Freq: Four times a day (QID) | OROMUCOSAL | Status: DC
  Administered 2012-10-04 (×2): 15 mL via OROMUCOSAL

## 2012-10-04 MED ORDER — PANTOPRAZOLE SODIUM 40 MG PO TBEC
40.0000 mg | DELAYED_RELEASE_TABLET | Freq: Every day | ORAL | Status: DC
  Administered 2012-10-04 – 2012-10-06 (×3): 40 mg via ORAL
  Filled 2012-10-04 (×3): qty 1

## 2012-10-04 MED ORDER — MIDAZOLAM HCL 2 MG/2ML IJ SOLN: 2.0000 mg | INTRAMUSCULAR | Status: DC | PRN

## 2012-10-04 MED ORDER — HALOPERIDOL LACTATE 5 MG/ML IJ SOLN
5.0000 mg | Freq: Four times a day (QID) | INTRAMUSCULAR | Status: DC | PRN
  Administered 2012-10-04: 5 mg via INTRAVENOUS
  Filled 2012-10-04: qty 1

## 2012-10-04 MED ORDER — IPRATROPIUM BROMIDE 0.02 % IN SOLN
0.5000 mg | Freq: Four times a day (QID) | RESPIRATORY_TRACT | Status: DC
  Administered 2012-10-05: 0.5 mg via RESPIRATORY_TRACT
  Filled 2012-10-04 (×3): qty 2.5

## 2012-10-04 MED ORDER — CHLORHEXIDINE GLUCONATE 0.12 % MT SOLN
15.0000 mL | Freq: Two times a day (BID) | OROMUCOSAL | Status: DC
  Administered 2012-10-04: 15 mL via OROMUCOSAL
  Filled 2012-10-04: qty 15

## 2012-10-04 MED ORDER — DEXTROSE-NACL 5-0.45 % IV SOLN: INTRAVENOUS | Status: DC

## 2012-10-04 MED ORDER — CHLORHEXIDINE GLUCONATE 0.12 % MT SOLN
OROMUCOSAL | Status: AC
  Administered 2012-10-04: 15 mL via OROMUCOSAL
  Filled 2012-10-04: qty 15

## 2012-10-04 MED ORDER — DEXMEDETOMIDINE HCL IN NACL 200 MCG/50ML IV SOLN
0.2000 ug/kg/h | INTRAVENOUS | Status: DC
  Filled 2012-10-04: qty 50

## 2012-10-04 MED ORDER — SODIUM CHLORIDE 0.9 % IV BOLUS (SEPSIS)
500.0000 mL | Freq: Once | INTRAVENOUS | Status: AC
  Administered 2012-10-04: 500 mL via INTRAVENOUS

## 2012-10-04 MED ORDER — POTASSIUM CHLORIDE 20 MEQ/15ML (10%) PO LIQD
40.0000 meq | Freq: Once | ORAL | Status: AC
  Administered 2012-10-04: 40 meq
  Filled 2012-10-04: qty 30

## 2012-10-04 MED ORDER — ALBUTEROL SULFATE (5 MG/ML) 0.5% IN NEBU
2.5000 mg | INHALATION_SOLUTION | Freq: Four times a day (QID) | RESPIRATORY_TRACT | Status: DC
  Administered 2012-10-05: 2.5 mg via RESPIRATORY_TRACT
  Filled 2012-10-04 (×2): qty 0.5

## 2012-10-04 NOTE — Progress Notes (Signed)
Dr. Kendrick Fries at bedside to insert coude catheter d/t urinary retention. Bladder scan showed of urine.Upon placing catheter, pt starts to urinate without any difficultly. Coude catheter not inserted at this time. Will continue to monitor patient. Pt currently has a condom catheter

## 2012-10-04 NOTE — Progress Notes (Signed)
eLink Physician-Brief Progress Note Patient Name: [name] [name] DOB: 09/25/1955 MRN: [id number]  Date of Service  10/04/2012   HPI/Events of Note  Hypotension with calculated FeNA of less then 1%.  Current BP of 82/56 (65) and HR of 112   eICU Interventions  Plan: 500 cc NS bolus for BP support   Intervention Category Intermediate Interventions: Hypotension - evaluation and management  DETERDING,ELIZABETH 10/04/2012, 3:55 AM

## 2012-10-04 NOTE — Progress Notes (Signed)
Inpatient Diabetes Program Recommendations  AACE/ADA: New Consensus Statement on Inpatient Glycemic Control (2013)  Target Ranges:  Prepandial:   less than 140 mg/dL      Peak postprandial:   less than 180 mg/dL (1-2 hours)      Critically ill patients:  140 - 180 mg/dL  Results for TYJUAN, DEMETRO (MRN [id number]) as of 10/04/2012 11:18  Ref. Range 10/03/2012 15:55 10/04/2012 05:30  Glucose Latest Range: 70-99 mg/dL 621 (H) 308 (H)   Inpatient Diabetes Program Recommendations Correction (SSI): Start Sensitive scale Q 4 if NPO during steroid therapy (Glycemic Control Order set) Thank you  Piedad Climes BSN, RN,CDE Inpatient Diabetes Coordinator [phone] (team pager)

## 2012-10-04 NOTE — Clinical Social Work Psychosocial (Signed)
     Clinical Social Work Department BRIEF PSYCHOSOCIAL ASSESSMENT 10/04/2012  Patient:  [name] [name],[name] [name]     Account Number:  [id number]     Admit date:  10/03/2012  Clinical Social Worker:  Margaree Mackintosh  Date/Time:  10/04/2012 10:57 AM  Referred by:  Physician  Date Referred:  10/04/2012 Referred for  Other - See comment   Other Referral:   Grief and Loss.   Interview type:  Other - See comment Other interview type:   Physcian.    PSYCHOSOCIAL DATA Living Status:  ALONE Admitted from facility:   Level of care:   Primary support name:  [name] [name]: [phone] Primary support relationship to patient:  SPOUSE Degree of support available:   Unknown.    CURRENT CONCERNS Current Concerns  Post-Acute Placement  Other - See comment   Other Concerns:   Grief and Loss.    SOCIAL WORK ASSESSMENT / PLAN Clinical Social Worker received referral from MD.  CSW staffed case with MD.  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  MD expressed concerns related to pt exhibiting signs of difficulty adjusting to medical diagnosis of Lung Cancer.  Pt extubated this morning.  CSW attempted to meet with pt, pt currently appears confused post extubation.  CSW to conitnue to follow and assist as needed.   Assessment/plan status:  Psychosocial Support/Ongoing Assessment of Needs Other assessment/ plan:   Information/referral to community resources:   [facility]    PATIENTS/FAMILYS RESPONSE TO PLAN OF CARE: Pt currently unable to fully participate due to medcial condition.  MD appeared receptive to CSW intervention.

## 2012-10-04 NOTE — Anesthesia Procedure Notes (Signed)
Procedure Name: Intubation Date/Time: 10/03/2012 1:25 PM Performed by: Orvilla Fus A Pre-anesthesia Checklist: Patient identified, Timeout performed, Emergency Drugs available, Suction available and Patient being monitored Patient Re-evaluated:Patient Re-evaluated prior to inductionOxygen Delivery Method: Circle system utilized Preoxygenation: Pre-oxygenation with 100% oxygen Intubation Type: IV induction Ventilation: Mask ventilation without difficulty Laryngoscope Size: Mac and 4 Grade View: Grade I Tube type: Oral Tube size: 8.5 mm Number of attempts: 1 Airway Equipment and Method: Stylet Placement Confirmation: ETT inserted through vocal cords under direct vision,  breath sounds checked- equal and bilateral and positive ETCO2 Secured at: 23 cm Tube secured with: Tape

## 2012-10-04 NOTE — Addendum Note (Signed)
Addendum created 10/04/12 0911 by Jodell Cipro, CRNA   Modules edited: Anesthesia Blocks and Procedures

## 2012-10-04 NOTE — Procedures (Signed)
Extubation Procedure Note  Patient Details:   Name: [name] [name] DOB: 15-Nov-1955 MRN: [id number]   Airway Documentation:  Airway 8 mm (Active)  Secured at (cm) 23 cm 10/04/2012  3:24 AM  Measured From Lips 10/04/2012  3:24 AM  Secured Location Left 10/04/2012  3:24 AM  Secured By [employer] 10/04/2012  3:24 AM  Tube Holder Repositioned Yes 10/04/2012  3:24 AM  Site Condition Dry 10/04/2012  3:24 AM    Evaluation  O2 sats: stable throughout Complications: No apparent complications Patient did tolerate procedure well. Bilateral Breath Sounds: Coarse crackles Suctioning: Airway Yes Pt extubated to a 4lpm [place], Sp02 97%, HR 109, rr 21. RT will continue to monitor pt progress.   Melanee Spry 10/04/2012, 9:27 AM

## 2012-10-04 NOTE — Progress Notes (Addendum)
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 Had inpatient [facility] admission in [place] early 2014 that resulted in discvery of Rt lung mass on CXR. PET scan Feb 2014 shwoed large carinal area PET hot mass with RB4 node/mass. On 10/03/12 DR [name] did EBUS bx (NSCLC prelim dx) that showed large polypoid friable mass in carinal area nearly occluding RMB and only partially occluding (< 25%) LMB. In both cases, scope was able to pass through distally. Post EBUS had obstructive physiology resp distress post extubation (during case as well EtCo2 not registering well) and had to be reintubated. Admitted to 2100 by Dr [name]  Pmhx : depression, anxiety, cachexa (related to above),. Prostate cancer in2007, hypertension and hyperlipidemia nos, hx of kidney stones, ? Old stroke     Lines / Drains: ETT 10/03/12 for EBUS >10/03/12, 10/03/12 in PACU >>  Micro: No results found for this basename: LATICACIDVEN, PCT,  in the last 168 hours Recent Results (from the past 240 hour(s))  MRSA PCR SCREENING     Status: None   Collection Time    10/03/12  6:33 PM      Result Value Range Status   MRSA by PCR NEGATIVE  NEGATIVE Final   Comment:            The GeneXpert MRSA Assay (FDA     approved for NASAL specimens     only), is one component of a     comprehensive MRSA colonization     surveillance program. It is not     intended to diagnose MRSA     infection nor to guide or     monitor treatment for     MRSA infections.    Antibiotics: Anti-infectives   None      Tests / Events: 10/03/2012 > post ebus failed extubation and admit ICU    Subjective/Overnight/Interval History: 10/04/12: tight airway physiology post intubation in PACU. But overnigth did well. This am on diprivan  - wake up assessment resulted in agitation  Vital Signs: Temp:  [96.7 F (35.9 C)-98.1 F (36.7 C)] 97.5 F (36.4 C) (03/06 0356) Pulse Rate:  [72-135] 72 (03/06 0700) Resp:  [9-25] 12 (03/06 0700) BP: (82-156)/(54-103) 92/70 mmHg (03/06 0700) SpO2:  [71 %-100 %] 100 % (03/06 0700) Arterial Line BP: (106-124)/(65-75) 113/69 mmHg (03/05 1707) FiO2 (%):  [0.6 %-60.1 %] 39.9 % (03/06 0700) Weight:  [59.5 kg (131 lb 2.8 oz)-63.05 kg (139 lb)] 60.5 kg (133 lb 6.1 oz) (03/06 0500) I/O last 3 completed shifts: In: 2863.2 [I.V.:2363.2; IV Piggyback:500] Out: 201 [Urine:201]  Physical Examination: General: cachectic male. On ventilator Neuro:  RASS -3 on diprivan. Per RN: was agitated  HEENT:  ET tube in place Neck:    Soft,  Cardiovascular: sinus tach   Chest:no wheeze, synchronous with vent Abdomen:  Soft, non tender, normal bowel sounds Musculoskeletal:  No cyanosis, no clubbing, no edema Skin:  intact Extremities: no edema  Ventilator settings: Vent Mode:  [-] PRVC FiO2 (%):  [0.6 %-60.1 %] 39.9 % Set Rate:  [12 bmp] 12 bmp Vt Set:  [600 mL] 600 mL PEEP:  [5 cmH20-5.1 cmH20] 5 cmH20 Plateau Pressure:  [18 cmH20-23 cmH20] 23 cmH20  Radiology x 48h  Dg Chest 2 View  10/03/2012  *RADIOLOGY REPORT*  Clinical Data:  Preop bronchoscopy/EBUS  CHEST - 2 VIEW  Comparison: PET CT dated 09/17/2012  Findings: Opacity in the right perihilar/infrahilar region, related to known mass and postobstructive opacity.  Left lung essentially clear. No pleural effusion or pneumothorax.  The heart is normal in size.  Mild degenerative changes of the visualized thoracolumbar spine.  IMPRESSION: Opacity in the right perihilar/infrahilar region, related to known mass and postobstructive opacity.   Original Report Authenticated By: Charline Bills, M.D.    Dg Chest Port 1 View  10/03/2012  *RADIOLOGY REPORT*  Clinical Data: Evaluate endotracheal tube placement.  PORTABLE CHEST - 1 VIEW  Comparison: Chest  x-ray 10/02/2012.  Findings: An endotracheal tube is in place with tip 5.9 cm above the carina.  Persistent and increasing opacity in the medial aspect of the right hemithorax likely to reflect worsening postobstructive atelectasis and/or consolidation, in the setting of a known perihilar mass.  Lungs otherwise appear clear.  No definite pleural effusions.  Probable skin fold artifact in the right apex.  No evidence of pulmonary edema.  Heart size is within normal limits. Mediastinal contours are otherwise unremarkable.  IMPRESSION: 1.  Tip of endotracheal tube is 5.9 cm above the carina. 2.  Increasing opacity in the right middle lobe likely reflects slight worsening of postobstructive changes related to known right perihilar mass.   Original Report Authenticated By: Trudie Reed, M.D.      ASSESSMENT AND PLAN  RESPIRATORY  Recent Labs Lab 10/03/12 1515 10/03/12 1644  PHART 7.163* 7.367  PCO2ART 74.7* 40.5  PO2ART 89.2 137.0*  HCO3 25.7* 23.0  O2SAT 92.9 99.9   A:  Post op resp failure 10/03/12 - 10/04/12: doing well with vent mechanici P: Do wua and aim to extubate Aggressive BD Solumedrol  CARDIAC No results found for this basename: TROPONINI, PROBNP,  in the last 168 hours A:nil acute P: 12 lead ekg  NEUROLOGIC A:  ? Agitated on wua. He is at risk due to depression/anxiety and recent statements that he did not want to stay in hospital post procedure P: - Change diprivan to precedex to faciltate extubation - Haldol prn   INFECTIOUS DISEASE  Recent Labs Lab 10/03/12 1045 10/03/12 1555 10/04/12 0530  WBC 6.9 6.6 8.2  No results found for this basename: LATICACIDVEN, PROCALCITON,  in the last 168 hours   A:  Nil acute P: Monitor off abx  RENAL and ELECTROLYTES   I/O last 3 completed shifts: In: 2863.2 [I.V.:2363.2; IV Piggyback:500] Out: 201 [Urine:201]  ELECTROLYTES  Recent Labs Lab 10/03/12 1045 10/03/12 1555 10/04/12 0530  NA 135 134* 135  K 3.4*  3.4* 3.5  CL 97 96 100  CO2 24 24 21   BUN 6 6 7   CREATININE 0.84 0.81 0.78  CALCIUM 9.6 9.3 9.2  MG  --  2.4 2.5  PHOS  --  5.7* 3.8   A:mild hypokalemia P: kcl 40  HEMATOLOGIC  Recent Labs Lab 10/03/12 1045 10/03/12 1555 10/04/12 0530  HGB 11.7* 11.1* 10.6*  HCT 35.3* 34.5* 32.3*  PLT 325 331 254  INR 1.13  --   --   APTT 47*  --   --    A:  Anemia of chronic and critical illness P: - PRBC for hgb </= 6.9gm%    - exceptions are   -  if ACS susepcted/confirmed then transfuse for hgb </= 8.0gm%,  or    -  If septic shock first 24h and scvo2 < 70% then transfuse for hgb </= 9.0gm%   -  active bleeding with hemodynamic instability, then transfuse regardless of hemoglobin value   At at all times try to transfuse 1 unit prbc as possible with exception of active hemorrhage    GASTROINTESTINAL  Recent Labs Lab 10/03/12 1045 10/03/12 1555  AST 14 13  ALT 6 6  ALKPHOS 87 86  BILITOT 0.3 0.3  PROT 7.1 6.8  ALBUMIN 2.7* 2.6*  INR 1.13  --    A:  Nil acute P: monitor  ENDOCRINE  Recent Labs Lab 10/03/12 1755 10/03/12 2022  GLUCAP 126* 110*   A:  No hx of dm P:  monitor  DERMATOLOGY A: no bed sore P: Monitor  ONCOLOGY A: New dx of likely NSCLC - wife knows dx. PAtient given dx post extubatin P - need to inform patient once he is extubated - has onc fu appt wit Dr Arbutus Ped < 7 days  GLOBAL:  10/03/12: wife was updated in details 3/6./14: wife not at bedside AM rounds at 8am and 10.30am     The patient is critically ill with multiple organ systems failure and requires high complexity decision making for assessment and support, frequent evaluation and titration of therapies, application of advanced monitoring technologies and extensive interpretation of multiple databases.   Critical Care Time devoted to patient care services described in this note is  35  Minutes.  Dr. Kalman Shan, M.D., [facility].C.P Pulmonary and Critical Care Medicine Staff  Physician  [facility] [place] Pulmonary and Critical Care Pager: [phone], If no answer or between  15:00h - 7:00h: call [phone]  10/04/2012 8:34 AM

## 2012-10-05 DIAGNOSIS — C3490 Malignant neoplasm of unspecified part of unspecified bronchus or lung: Secondary | ICD-10-CM | POA: Diagnosis present

## 2012-10-05 DIAGNOSIS — I369 Nonrheumatic tricuspid valve disorder, unspecified: Secondary | ICD-10-CM

## 2012-10-05 LAB — BASIC METABOLIC PANEL
BUN: 9 mg/dL (ref 6–23)
Chloride: 104 mEq/L (ref 96–112)
Creatinine, Ser: 0.68 mg/dL (ref 0.50–1.35)
GFR calc Af Amer: >90 mL/min (ref >90)
Glucose, Bld: 132 mg/dL — ABNORMAL HIGH (ref 70–99)

## 2012-10-05 LAB — GLUCOSE, CAPILLARY
Glucose-Capillary: 109 mg/dL — ABNORMAL HIGH (ref 70–99)
Glucose-Capillary: 108 mg/dL — ABNORMAL HIGH (ref 70–99)

## 2012-10-05 LAB — TROPONIN I
Troponin I: 0.36 ng/mL (ref <0.30)
Troponin I: 0.3 ng/mL (ref <0.30)

## 2012-10-05 MED ORDER — LEVALBUTEROL HCL 0.63 MG/3ML IN NEBU
0.6300 mg | INHALATION_SOLUTION | Freq: Four times a day (QID) | RESPIRATORY_TRACT | Status: DC
  Administered 2012-10-05 – 2012-10-06 (×2): 0.63 mg via RESPIRATORY_TRACT
  Filled 2012-10-05 (×5): qty 3

## 2012-10-05 MED ORDER — ASPIRIN EC 81 MG PO TBEC
81.0000 mg | DELAYED_RELEASE_TABLET | Freq: Every day | ORAL | Status: DC
  Administered 2012-10-05 – 2012-10-07 (×3): 81 mg via ORAL
  Filled 2012-10-05 (×3): qty 1

## 2012-10-05 MED ORDER — PREDNISONE 50 MG PO TABS
50.0000 mg | ORAL_TABLET | Freq: Every day | ORAL | Status: DC
  Filled 2012-10-05: qty 1

## 2012-10-05 MED ORDER — DILTIAZEM 12 MG/ML ORAL SUSPENSION
30.0000 mg | Freq: Four times a day (QID) | ORAL | Status: DC
  Administered 2012-10-05 – 2012-10-06 (×5): 30 mg via ORAL
  Filled 2012-10-05 (×8): qty 3

## 2012-10-05 MED ORDER — IPRATROPIUM BROMIDE 0.02 % IN SOLN
0.5000 mg | Freq: Four times a day (QID) | RESPIRATORY_TRACT | Status: DC
  Administered 2012-10-05 – 2012-10-06 (×2): 0.5 mg via RESPIRATORY_TRACT
  Filled 2012-10-05 (×3): qty 2.5

## 2012-10-05 MED ORDER — PREDNISONE 20 MG PO TABS
40.0000 mg | ORAL_TABLET | Freq: Every day | ORAL | Status: DC
  Administered 2012-10-06 – 2012-10-07 (×2): 40 mg via ORAL
  Filled 2012-10-05 (×4): qty 2

## 2012-10-05 NOTE — Progress Notes (Signed)
CRITICAL VALUE ALERT  Critical value received:  Troponin 0.36  Date of notification:  10/05/12  Time of notification:  1157  Critical value read back:yes  Nurse who received alert:  Lavell Anchors, RN  MD notified (1st page):  Ramaswamy   Time of first page:  1230  MD notified (2nd page): Anders Simmonds   Time of second page: 1245  Responding MD:  Anders Simmonds  Time MD responded:  1250, No new orders at this time.

## 2012-10-05 NOTE — Progress Notes (Signed)
PT/OT Cancellation Note  Patient Details Name: [name] [name] MRN: [id number] DOB: 19-Nov-1955   Cancelled Treatment:    Reason Eval/Treat Not Completed: Medical issues which prohibited therapy (elevated troponin level today, hold PT/OT per RN). Will follow.    Ralene Bathe Kistler 10/05/2012, 1:16 PM

## 2012-10-05 NOTE — Progress Notes (Signed)
eLink Physician-Brief Progress Note Patient Name: [name] [name] DOB: August 09, 1955 MRN: [id number]  Date of Service  10/05/2012   HPI/Events of Note  Pos trops   eICU Interventions  ASA 81 daily   Intervention Category Intermediate Interventions: Diagnostic test evaluation  Britaney Espaillat V. 10/05/2012, 8:31 PM

## 2012-10-05 NOTE — Progress Notes (Signed)
SW will await PT evaluation for discharge disposition.  Sabino Niemann, MSW, [phone]

## 2012-10-05 NOTE — Progress Notes (Addendum)
Name: [name] [name] DOB: 1956-03-09 MRN: [id number] PCP: [name], MD ADMIT DATE: 10/03/2012 LOS: 2  PCCM PROGRESS NOTE  Brief Patient Profile:  SMoker with COPD (fev1 1.6L/44%) severe depression (lost business, separated from wife legally but she is still dpoa). Had inpatient [facility] admission in [place] early 2014 that resulted in discvery of Rt lung mass on CXR. PET scan Feb 2014 shwoed large carinal area PET hot mass with RB4 node/mass. On 10/03/12 DR [name] did EBUS bx (NSCLC prelim dx) that showed large polypoid friable mass in carinal area nearly occluding RMB and only partially occluding (< 25%) LMB. In both cases, scope was able to pass through distally. Post EBUS had obstructive physiology resp distress post extubation (during case as well EtCo2 not registering well) and had to be reintubated. Admitted to 2100 by Dr [name]  Pmhx : depression, anxiety, cachexa (related to above),. Prostate cancer in2007, hypertension and hyperlipidemia nos, hx of kidney stones, ? Old stroke   Lines / Drains: ETT 10/03/12 for EBUS >10/03/12, 10/03/12 in PACU >>3/6  Micro:   Antibiotics:  Tests / Events: 10/03/2012 >  post ebus failed extubation and admit ICU, 10/04/12  Subjective/Overnight/Interval History: No distress but now in AF ww/RVR   Vital Signs: Temp:  [97.6 F (36.4 C)-98.1 F (36.7 C)] 98.1 F (36.7 C) (03/07 0417) Pulse Rate:  [72-96] 90 (03/07 0417) Resp:  [15-28] 18 (03/07 0417) BP: (79-99)/(41-75) 96/64 mmHg (03/07 0417) SpO2:  [94 %-100 %] 94 % (03/07 0417) FiO2 (%):  [40 %] 40 % (03/06 0922) Weight:  [59.6 kg (131 lb 6.3 oz)] 59.6 kg (131 lb 6.3 oz) (03/06 2309) I/O last 3 completed shifts: In: 2320.4 [P.O.:385; I.V.:1375.4; NG/GT:60; IV Piggyback:500] Out: 2021 [Urine:2021] Room air   Physical Examination: General: cachectic male. No distress, but "feels terrible" Neuro:  Awake, oriented, weak  HEENT:No JVD  Cardiovascular: sinus tach   Chest: diffuse rhonchi  and wheeze.  Abdomen:  Soft, non tender, normal bowel sounds Musculoskeletal:  No cyanosis, no clubbing, no edema Skin:  intact Extremities: no edema  Ventilator settings: Vent Mode:  [-] CPAP;PSV FiO2 (%):  [40 %] 40 % PEEP:  [5.1 cmH20] 5.1 cmH20  Radiology x 48h  Dg Chest 2 View  10/03/2012  *RADIOLOGY REPORT*  Clinical Data: Preop bronchoscopy/EBUS  CHEST - 2 VIEW  Comparison: PET CT dated 09/17/2012  Findings: Opacity in the right perihilar/infrahilar region, related to known mass and postobstructive opacity.  Left lung essentially clear. No pleural effusion or pneumothorax.  The heart is normal in size.  Mild degenerative changes of the visualized thoracolumbar spine.  IMPRESSION: Opacity in the right perihilar/infrahilar region, related to known mass and postobstructive opacity.   Original Report Authenticated By: Charline Bills, M.D.    Dg Chest Port 1 View  10/03/2012  *RADIOLOGY REPORT*  Clinical Data: Evaluate endotracheal tube placement.  PORTABLE CHEST - 1 VIEW  Comparison: Chest x-ray 10/02/2012.  Findings: An endotracheal tube is in place with tip 5.9 cm above the carina.  Persistent and increasing opacity in the medial aspect of the right hemithorax likely to reflect worsening postobstructive atelectasis and/or consolidation, in the setting of a known perihilar mass.  Lungs otherwise appear clear.  No definite pleural effusions.  Probable skin fold artifact in the right apex.  No evidence of pulmonary edema.  Heart size is within normal limits. Mediastinal contours are otherwise unremarkable.  IMPRESSION: 1.  Tip of endotracheal tube is 5.9 cm above the carina. 2.  Increasing opacity in  the right middle lobe likely reflects slight worsening of postobstructive changes related to known right perihilar mass.   Original Report Authenticated By: Trudie Reed, M.D.     Recent Labs Lab 10/03/12 1045 10/03/12 1555 10/04/12 0530  NA 135 134* 135  K 3.4* 3.4* 3.5  CL 97 96 100   CO2 24 24 21   BUN 6 6 7   CREATININE 0.84 0.81 0.78  GLUCOSE 123* 168* 208*    Recent Labs Lab 10/03/12 1045 10/03/12 1555 10/04/12 0530  HGB 11.7* 11.1* 10.6*  HCT 35.3* 34.5* 32.3*  WBC 6.9 6.6 8.2  PLT 325 331 254       ASSESSMENT AND PLAN  Post op resp failure - post-op airway obstruction physiology. 10/03/12- 10/04/12: extubated.  COPD (gold D), possibly mild exacerbation s/p extubation New dx of likely NSCLC - wife knows dx. Patient given dx post extubation Currently on room air.  P: Transition to oral pred Resume home BDs Has not been seen by ONC. Not even sure he is a candidate for therapy given functional status. Given discussion w/ patient we will have palliative care see the patient first before we even call ONC Has ONC f/u   AF w/ RVR noticed on monitor.  -  noticed on 10/05/2012. Wife says that several weeks ago similar episode at primary care physician's office her EKG was normal. Currently EEG shows normal sinus rhythm (attending physician statement]  P: Cycle CEs  Add low dose CCB (has very poor FEV1 so will avoid BB) Hold off from anticoagulation for now Cont tele Change albut to xopenex  Ck lytes   ? Agitated on wua. Better. Now just seems depressed.  Has h/o depression/anxiety and recent statements that he did not want to stay in hospital post procedure P: Resume home meds.   Anemia of chronic and critical illness  Recent Labs Lab 10/03/12 1045 10/03/12 1555 10/04/12 0530  HGB 11.7* 11.1* 10.6*  HCT 35.3* 34.5* 32.3*  PLT 325 331 254  INR 1.13  --   --   APTT 47*  --   --   P: - PRBC for hgb </= 6.9gm%    - exceptions are   -  if ACS susepcted/confirmed then transfuse for hgb </= 8.0gm%,  or    -  If septic shock first 24h and scvo2 < 70% then transfuse for hgb </= 9.0gm%   - active bleeding with hemodynamic instability, then transfuse regardless of hemoglobin value   At at all times try to transfuse 1 unit prbc as possible with  exception of active hemorrhage  Hyperglycemia Likely steroid induced. Have cut dosing.  P: Check CBG AC and HS. May need to add insulin  GLOBAL: Attending physician statement: A he might or might not have paroxysmal atrial fibrillation. Current EKG is normal. I think he can be discharged today 10/05/2012. However wife says there is no power at home. Also due to poor weather she cannot get him 10/05/12. She plans to take patient home 10/06/12 and is requesting home health aide.   Patient iniitally said no to chemo/xrt but to attending he says he wants to hear out options, so will cancel palliative care meet for now. Opd fu  DR Arbutus Ped 10/11/12 at 15.45 and then after that with DR Ramaswamy < 2-3 weeks (nees to be made)  Wife updated on phone   Dr. Kalman Shan, M.D., [facility].C.P Pulmonary and Critical Care Medicine Staff Physician [facility] Pager: [phone]  [phone], If no answer or between  15:00h - 7:00h: call [phone]  10/05/2012 12:25 PM

## 2012-10-05 NOTE — Progress Notes (Signed)
Admitted patient to room 6709 via wheelchair. Alert and oriented x 2. Skin warm and dry. Respiration even and unlabored. On and off Productive cough. Hemoptysis noted with minimal clotted blood with the sputum. Conom cath intact. Pt. Very weak. Requires 1-2 assist.

## 2012-10-05 NOTE — Care Management Note (Signed)
   CARE MANAGEMENT NOTE 10/05/2012  Patient:  [name] [name]   Account Number:  [id number]  Date Initiated:  10/04/2012  Documentation initiated by:  [facility]  Subjective/Objective Assessment:   Reintubated post bronch on 10-03-12.  Lives alone, has wife but legally separated.     Action/Plan:   3/7 Order for [facility] however, also noted plan for Palliative consult, will await Palliative recommendations before arranging [facility] as pt may be appropriate for Home Hospice Services.   Anticipated DC Date:  10/11/2012   Anticipated DC Plan:  HOME W HOME HEALTH SERVICES  In-house referral  Clinical Social Worker      DC Planning Services  CM consult      Choice offered to / List presented to:             Status of service:  In process, will continue to follow Medicare Important Message given?   (If response is "NO", the following Medicare IM given date fields will be blank) Date Medicare IM given:   Date Additional Medicare IM given:    Discharge Disposition:    Per UR Regulation:  Reviewed for med. necessity/level of care/duration of stay  If discussed at Long Length of Stay Meetings, dates discussed:    Comments:  Contact:  [name] [name] Spouse [phone]   [phone]

## 2012-10-05 NOTE — Discharge Summary (Signed)
Physician Discharge Summary     Patient ID: [name] [name] MRN: [id number] DOB/AGE: 29-Jan-1956 57 y.o.  Admit date: 10/03/2012 Discharge date: 10/07/2012  Admission Diagnoses: Acute respiratory failure   Discharge Diagnoses:  Active Problems:   Acute-on-chronic respiratory failure   Squamous cell lung cancer   Atrial fibrillation Depression   Significant Hospital tests/ studies/ interventions and procedures    Lines / Drains:  ETT 10/03/12 for EBUS >10/03/12, 10/03/12 in PACU >>3/6    HPI  Smoker with COPD (fev1 1.6L/44%) severe depression (lost business, separated from wife legally but she is still dpoa). Had inpatient [facility] admission in [place] early 2014 that resulted in discvery of Rt lung mass on CXR. PET scan Feb 2014 shwoed large carinal area PET hot mass with RB4 node/mass. On 10/03/12 DR [name] did EBUS bx (NSCLC prelim dx) that showed large polypoid friable mass in carinal area nearly occluding RMB and only partially occluding (< 25%) LMB. In both cases, scope was able to pass through distally. Post EBUS had obstructive physiology & resp distress post extubation (during case as well EtCo2 not registering well) and had to be reintubated. Admitted to 2100 by Dr [name].  Biopsy consistent with non small cell carcinoma / squamous cell carcinoma.  Patient seen by Palliative Care prior to discharge.    PMH : depression, anxiety, cachexa (related to above),. Prostate cancer in 2007, hypertension and hyperlipidemia, hx of kidney stones, ? Old stroke   Hospital Course:  Post op resp failure - post-op airway obstruction physiology. 10/03/12- 10/04/12: extubated.  COPD (gold D), possibly mild exacerbation s/p extubation.  New dx of likely NSCLC - wife knows dx. Patient given dx post extubation. On room air at time of discharge.  Admitted to the ICU after OR. Placed on systemic steroids and BDs, weaned and extubated the next day.   Discharge Plan: -wean prednisone to off -Resume home  BDs  -Has not been seen by ONC. Not even sure he is a candidate for therapy given functional status.  Has ONC follow up for discharge. -F/u one week w/ [name]  -Seen by Palliative Care prior to discharge.   -Home care arranged by wife with private home care agency for day of discharge -outpatient social work consult via oncology center -ongoing palliative care measures  Atrial Fibrillation w/ RVR Noticed on 10/05/2012. Wife noted that several weeks ago similar episode at primary care physician's office & EKG was normal. Currently EEG shows normal sinus rhythm. Had mild CE elevation.  ECHO with EF of 40-45% .  Evaluated by Cardiology and felt to be consistent with NSTEMI. Patient converted to NSR prior to discharge.  Rate control achieved with metoprolol.  Anticoagulation avoided with conversion to NSR.   Significant concern for patient reliability / adherence to follow up given reported history of self neglect (does not go to MD, sleeps all the time, minimal contact with others).     Recent Labs Lab 10/05/12 1035 10/05/12 1515 10/05/12 2051  TROPONINI 0.36* 0.30* 0.30*   Discharge Plan: -Rate control with metoprolol -follow up with Cardiology as outpatient for Afib / NSTEMI -ASA -Lipitor   Depression History of depression/anxiety   Discharge Plan: -Resume home meds -follow up with outpatient counselor as prior to admit   Anemia of chronic and critical illness  Recent Labs  Recent Labs Lab 10/03/12 1555 10/04/12 0530 10/07/12 0530  HGB 11.1* 10.6* 11.5*   P:  -PRN CBC -further work up as outpatient  Hyperglycemia  Likely steroid induced.  Anticipate will resolve as steroids taper.  No further follow up at this time.     Discharge Exam: BP 117/72  Pulse 75  Temp(Src) 98.2 F (36.8 C) (Oral)  Resp 18  Ht 5\' 4"  (1.626 m)  Wt 122 lb 12.7 oz (55.7 kg)  BMI 21.07 kg/m2  SpO2 97% Room air   Physical Examination:  General: cachectic male. No distress Neuro:  Awake, oriented, weak.  Appears depressed HEENT:No JVD  Cardiovascular: sinus tach  Chest: resp's even/non-labored, lungs bilaterally with faint wheeze. Abdomen: Soft, non tender, normal bowel sounds  Musculoskeletal: No cyanosis, no clubbing, no edema  Skin: intact  Extremities: no edema  Labs at discharge Lab Results  Component Value Date   CREATININE 0.67 10/07/2012   BUN 12 10/07/2012   NA 138 10/07/2012   K 3.9 10/07/2012   CL 101 10/07/2012   CO2 26 10/07/2012   Lab Results  Component Value Date   WBC 7.5 10/07/2012   HGB 11.5* 10/07/2012   HCT 36.2* 10/07/2012   MCV 86.2 10/07/2012   PLT 309 10/07/2012   Lab Results  Component Value Date   ALT 13 10/07/2012   AST 21 10/07/2012   ALKPHOS 79 10/07/2012   BILITOT 0.3 10/07/2012   Lab Results  Component Value Date   INR 1.13 10/03/2012   INR 0.87 08/15/2009     Disposition:  home       Discharge Orders   Future Orders Complete By Expires     Call MD for:  difficulty breathing, headache or visual disturbances  As directed     Call MD for:  persistant dizziness or light-headedness  As directed     Call MD for:  temperature >100.4  As directed     Diet - low sodium heart healthy  As directed     Discharge instructions  As directed     Comments:      Follow up with MD's as above.  If you do not hear from the Pulmonary office, please call for appt to be seen.    Increase activity slowly  As directed         Medication List    TAKE these medications       ARIPiprazole 10 MG tablet  Commonly known as:  ABILIFY  Take 10 mg by mouth daily.     aspirin 81 MG EC tablet  Take 1 tablet (81 mg total) by mouth daily.     atorvastatin 40 MG tablet  Commonly known as:  LIPITOR  Take 1 tablet (40 mg total) by mouth daily at 6 PM.     COMBIVENT 18-103 MCG/ACT inhaler  Generic drug:  albuterol-ipratropium  Inhale 1 puff into the lungs 4 (four) times daily.     metoprolol tartrate 25 MG tablet  Commonly known as:  LOPRESSOR  Take 1  tablet (25 mg total) by mouth 2 (two) times daily.     mirtazapine 45 MG tablet  Commonly known as:  REMERON  Take 45 mg by mouth at bedtime.     predniSONE 20 MG tablet  Commonly known as:  DELTASONE  2 tabs daily for 4 days, then 1 tab daily for 4 days, then 1/2 tab daily for 4 days then stop     traZODone 50 MG tablet  Commonly known as:  DESYREL  Take 50 mg by mouth at bedtime.       Follow-up Information   Follow up with PARRETT,TAMMY, NP. Schedule an appointment  as soon as possible for a visit in 1 week. (call us at [phone] if you do not hear back by monday afternoon)    Contact information:   [address] [place] [address] [phone]       Follow up with Lajuana Matte., MD. (keep your appointment with oncology )    Contact information:   [address] [place] [address] [phone]       Call Olga Millers, MD. (For hospital follow up of NSTEMI & AFlutter)    Contact information:   [address] [place], [address]                         [address] [place] [address] [phone]       Discharged Condition: good  Physician Statement:   The patient was personally examined, the discharge assessment and plan has been reviewed and I agree with assessment and plan. > 30 minutes of time have been dedicated to discharge assessment, planning and discharge instructions.   Signed: Canary Brim, NP-C [place] Pgr: [phone] or [phone]  Signed: Lonzo Cloud. Kriste Basque, MD   10/07/2012, 9:49 PM

## 2012-10-05 NOTE — Progress Notes (Signed)
  Echocardiogram 2D Echocardiogram has been performed.  [name] [name], [name] [name] 10/05/2012, 4:01 PM

## 2012-10-06 ENCOUNTER — Encounter (HOSPITAL_COMMUNITY): Payer: Self-pay | Admitting: Cardiology

## 2012-10-06 DIAGNOSIS — I2129 ST elevation (STEMI) myocardial infarction involving other sites: Secondary | ICD-10-CM

## 2012-10-06 DIAGNOSIS — I10 Essential (primary) hypertension: Secondary | ICD-10-CM

## 2012-10-06 LAB — GLUCOSE, CAPILLARY
Glucose-Capillary: 126 mg/dL — ABNORMAL HIGH (ref 70–99)
Glucose-Capillary: 108 mg/dL — ABNORMAL HIGH (ref 70–99)

## 2012-10-06 LAB — BASIC METABOLIC PANEL
BUN: 12 mg/dL (ref 6–23)
Chloride: 105 mEq/L (ref 96–112)
Creatinine, Ser: 0.72 mg/dL (ref 0.50–1.35)
GFR calc Af Amer: >90 mL/min (ref >90)
Glucose, Bld: 97 mg/dL (ref 70–99)

## 2012-10-06 MED ORDER — IPRATROPIUM BROMIDE 0.02 % IN SOLN
0.5000 mg | Freq: Three times a day (TID) | RESPIRATORY_TRACT | Status: DC
  Administered 2012-10-06 (×2): 0.5 mg via RESPIRATORY_TRACT
  Filled 2012-10-06 (×2): qty 2.5

## 2012-10-06 MED ORDER — POTASSIUM CHLORIDE CRYS ER 20 MEQ PO TBCR
20.0000 meq | EXTENDED_RELEASE_TABLET | Freq: Two times a day (BID) | ORAL | Status: DC
  Administered 2012-10-06 – 2012-10-07 (×3): 20 meq via ORAL
  Filled 2012-10-06 (×4): qty 1

## 2012-10-06 MED ORDER — LEVALBUTEROL HCL 0.63 MG/3ML IN NEBU
0.6300 mg | INHALATION_SOLUTION | Freq: Three times a day (TID) | RESPIRATORY_TRACT | Status: DC
  Administered 2012-10-06 (×2): 0.63 mg via RESPIRATORY_TRACT
  Filled 2012-10-06 (×7): qty 3

## 2012-10-06 MED ORDER — ATORVASTATIN CALCIUM 40 MG PO TABS
40.0000 mg | ORAL_TABLET | Freq: Every day | ORAL | Status: DC
  Administered 2012-10-06: 40 mg via ORAL
  Filled 2012-10-06 (×2): qty 1

## 2012-10-06 MED ORDER — POTASSIUM CHLORIDE CRYS ER 20 MEQ PO TBCR
40.0000 meq | EXTENDED_RELEASE_TABLET | Freq: Once | ORAL | Status: AC
  Administered 2012-10-06: 40 meq via ORAL
  Filled 2012-10-06: qty 2

## 2012-10-06 MED ORDER — METOPROLOL TARTRATE 12.5 MG HALF TABLET
12.5000 mg | ORAL_TABLET | Freq: Two times a day (BID) | ORAL | Status: DC
  Administered 2012-10-06: 12.5 mg via ORAL
  Filled 2012-10-06 (×4): qty 1

## 2012-10-06 NOTE — Plan of Care (Signed)
Problem: Phase I Progression Outcomes Goal: EF % per last Echo/documented,Core Reminder form on chart Outcome: Completed/Met Date Met:  10/06/12 40-45%

## 2012-10-06 NOTE — Progress Notes (Signed)
Thank you for consulting the Palliative Medicine Team at [facility] to meet your patient's and family's needs.   The reason that you asked [place] to see your patient is  For GOC  We have scheduled your patient for a meeting:  10/06/12 at 11 am  The Surrogate decision make is: x wife [name] [name] Contact information: [phone]  Other family members that need to be present:  None    Your patient is able/unable to participate: able  Additional Narrative:  Patient is struggling with depression and decisions related to treating his cancer vs not treating.  Scheduled for first visit with Oncology next Thursday.  Melissa L. Ladona Ridgel, MD MBA The Palliative Medicine Team at [facility] Phone: [phone] Pager: [phone]

## 2012-10-06 NOTE — Consult Note (Signed)
HPI: 57 year old male with no prior cardiac history for evaluation of more ST elevation myocardial infarction. Patient recently found to have a lung mass. 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 He developed respiratory distress following extubation and had to be reintubated. Per notes there was a question of atrial fibrillation noted on telemetry. I do not have the strips to verify this. Cardiac enzymes were drawn and are minimally elevated at 0.36. Electrocardiogram shows sinus rhythm with anterior and inferior T-wave inversion. Echocardiogram shows an ejection fraction of 40-45% and apical/septal wall motion abnormality. Cardiology is asked to evaluate. Patient denies dyspnea, orthopnea, PND or pedal edema. No palpitations or syncope. He is a very difficult historian but denies recent chest pain. He has had some chest tightness with exertion in the past 12 months but finds this difficult to describe.   Medications Prior to Admission  Medication Sig Dispense Refill  . albuterol-ipratropium (COMBIVENT) 18-103 MCG/ACT inhaler Inhale 1 puff into the lungs 4 (four) times daily.      . ARIPiprazole (ABILIFY) 10 MG tablet Take 10 mg by mouth daily.      . mirtazapine (REMERON) 45 MG tablet Take 45 mg by mouth at bedtime.      . traZODone (DESYREL) 50 MG tablet Take 50 mg by mouth at bedtime.        Allergies  Allergen Reactions  . Erythromycin Ethylsuccinate     REACTION: unspecified  . Sildenafil     REACTION: just didn't tolerate    Past Medical History  Diagnosis Date  . Prostate cancer 10/2005    Dr. Achilles Dunk [phone])  . Impaired glucose tolerance   . HLD (hyperlipidemia)   . HTN (hypertension) 1992  . BPH (benign prostatic hypertrophy)   . History of kidney stones   . Anxiety   . CVA (cerebral infarction) 07/2009  . Depression   . Shortness of breath     at rest, heavy smoker    Past Surgical History  Procedure Laterality Date  .  Ureterscopic stone removal  09/2002  . Lung biopsy  01/2004    neg cancer  . Rectal examination under anesthesia w/ cystoscopy  09/2005    BPH only  . Hifu  12/2005    prostate cancer  . Lih  09/2008    Dr. Achilles Dunk  . Video bronchoscopy with endobronchial ultrasound N/A 10/03/2012    Procedure: VIDEO BRONCHOSCOPY WITH ENDOBRONCHIAL ULTRASOUND;  Surgeon: Kalman Shan, MD;  Location: [facility] OR;  Service: Cardiopulmonary;  Laterality: N/A;    History   Social History  . Marital Status: Married    Spouse Name: N/A    Number of Children: 2  . Years of Education: N/A   Occupational History  . [employer] Self Employed   Social History Main Topics  . Smoking status: Current Every Day Smoker -- 1.00 packs/day for 42 years    Types: Cigarettes  . Smokeless tobacco: Not on file  . Alcohol Use: Yes  . Drug Use: No  . Sexually Active: No   Other Topics Concern  . Not on file   Social History Narrative  . No narrative on file    Family History  Problem Relation Age of Onset  . Coronary artery disease Father   . Brain cancer Father     tumor  . Hypertension Mother   . Diabetes Mother     early  . Dementia Mother  ROS:   productive cough and hemoptysis but no fevers or chills, dysphasia, odynophagia, melena, hematochezia, dysuria, hematuria, rash, seizure activity, orthopnea, PND, pedal edema, claudication. Remaining systems are negative.  Physical Exam:   Blood pressure 125/82, pulse 96, temperature 98.1 F (36.7 C), temperature source Oral, resp. rate 24, height 5\' 4"  (1.626 m), weight 127 lb 10.3 oz (57.9 kg), SpO2 95.00%.  General:  Well developed/cachectic in NAD Skin warm/dry Patient not depressed  peripheral clubbing noted Back-normal HEENT-normal/normal eyelids Neck supple/normal carotid upstroke bilaterally; no bruits; no JVD; no thyromegaly chest - diminished breath sounds throughout with mild rhonchi and expiratory wheeze. CV - RRR/normal S1  and S2; no murmurs, rubs or gallops;  PMI nondisplaced Abdomen -NT/ND, no HSM, no mass, + bowel sounds, no bruit 2+ femoral pulses, no bruits Ext-no edema, chords, 2+ DP Neuro-grossly nonfocal  ECG  sinus rhythm, septal MI, anterior/inferior T waves inversion. Prolonged QT.  Results for orders placed during the hospital encounter of 10/03/12 (from the past 48 hour(s))  TROPONIN I     Status: Abnormal   Collection Time    10/05/12 10:35 AM      Result Value Range   Troponin I 0.36 (*) <0.30 ng/mL   Comment:            Due to the release kinetics of cTnI,     a negative result within the first hours     of the onset of symptoms does not rule out     myocardial infarction with certainty.     If myocardial infarction is still suspected,     repeat the test at appropriate intervals.     CRITICAL RESULT CALLED TO, READ BACK BY AND VERIFIED WITH:     Rito Ehrlich, RN @1157  ON 10/05/12 BY SASSUD/BARRK MT  BASIC METABOLIC PANEL     Status: Abnormal   Collection Time    10/05/12 10:35 AM      Result Value Range   Sodium 140  135 - 145 mEq/L   Potassium 3.4 (*) 3.5 - 5.1 mEq/L   Chloride 104  96 - 112 mEq/L   CO2 26  19 - 32 mEq/L   Glucose, Bld 132 (*) 70 - 99 mg/dL   BUN 9  6 - 23 mg/dL   Creatinine, Ser 9.56  0.50 - 1.35 mg/dL   Calcium 8.7  8.4 - 21.3 mg/dL   GFR calc non Af Amer >90  >90 mL/min   GFR calc Af Amer >90  >90 mL/min   Comment:            The eGFR has been calculated     using the CKD EPI equation.     This calculation has not been     validated in all clinical     situations.     eGFR's persistently     <90 mL/min signify     possible Chronic Kidney Disease.  GLUCOSE, CAPILLARY     Status: Abnormal   Collection Time    10/05/12 12:21 PM      Result Value Range   Glucose-Capillary 128 (*) 70 - 99 mg/dL  TROPONIN I     Status: Abnormal   Collection Time    10/05/12  3:15 PM      Result Value Range   Troponin I 0.30 (*) <0.30 ng/mL   Comment:            Due to  the release kinetics of cTnI,  a negative result within the first hours     of the onset of symptoms does not rule out     myocardial infarction with certainty.     If myocardial infarction is still suspected,     repeat the test at appropriate intervals.     CRITICAL VALUE NOTED.  VALUE IS CONSISTENT WITH PREVIOUSLY REPORTED AND CALLED VALUE.  GLUCOSE, CAPILLARY     Status: Abnormal   Collection Time    10/05/12  5:12 PM      Result Value Range   Glucose-Capillary 109 (*) 70 - 99 mg/dL  TROPONIN I     Status: Abnormal   Collection Time    10/05/12  8:51 PM      Result Value Range   Troponin I 0.30 (*) <0.30 ng/mL   Comment:            Due to the release kinetics of cTnI,     a negative result within the first hours     of the onset of symptoms does not rule out     myocardial infarction with certainty.     If myocardial infarction is still suspected,     repeat the test at appropriate intervals.     CRITICAL VALUE NOTED.  VALUE IS CONSISTENT WITH PREVIOUSLY REPORTED AND CALLED VALUE.  GLUCOSE, CAPILLARY     Status: Abnormal   Collection Time    10/05/12  9:00 PM      Result Value Range   Glucose-Capillary 108 (*) 70 - 99 mg/dL  BASIC METABOLIC PANEL     Status: Abnormal   Collection Time    10/06/12  5:05 AM      Result Value Range   Sodium 141  135 - 145 mEq/L   Potassium 3.2 (*) 3.5 - 5.1 mEq/L   Chloride 105  96 - 112 mEq/L   CO2 28  19 - 32 mEq/L   Glucose, Bld 97  70 - 99 mg/dL   BUN 12  6 - 23 mg/dL   Creatinine, Ser 1.61  0.50 - 1.35 mg/dL   Calcium 8.8  8.4 - 09.6 mg/dL   GFR calc non Af Amer >90  >90 mL/min   GFR calc Af Amer >90  >90 mL/min   Comment:            The eGFR has been calculated     using the CKD EPI equation.     This calculation has not been     validated in all clinical     situations.     eGFR's persistently     <90 mL/min signify     possible Chronic Kidney Disease.  PHOSPHORUS     Status: None   Collection Time    10/06/12  5:05  AM      Result Value Range   Phosphorus 2.8  2.3 - 4.6 mg/dL  MAGNESIUM     Status: None   Collection Time    10/06/12  5:05 AM      Result Value Range   Magnesium 2.3  1.5 - 2.5 mg/dL  GLUCOSE, CAPILLARY     Status: Abnormal   Collection Time    10/06/12  8:12 AM      Result Value Range   Glucose-Capillary 103 (*) 70 - 99 mg/dL   Comment 1 Documented in Chart     Comment 2 Notify RN    GLUCOSE, CAPILLARY     Status: Abnormal   Collection Time  10/06/12 12:23 PM      Result Value Range   Glucose-Capillary 109 (*) 70 - 99 mg/dL   Comment 1 Documented in Chart     Comment 2 Notify RN      No results found.  Assessment/Plan 1 non-ST elevation myocardial infarction. Based on his electrocardiogram it appears to be recent. His troponin is minimally elevated. His echocardiogram shows a septal and apical wall motion abnormality. He is complicated because of his coexistent recently diagnosis lung cancer. Based on chart review it is not clear that he wants chemotherapy and there's discussion about possible hospice placement. He clearly is not a candidate for coronary artery bypass graft. He would also be a poor candidate for PCI at this point given the uncertainty of the aggressiveness of care for his cancer. He also has hemoptysis and may require chemotherapy which would lead to thrombocytopenia. Therefore anticoagulation would be difficult. For now I will treat with aspirin, Lopressor and statin. I will not add Plavix given his ongoing hemoptysis. I would favor medical therapy based on the above discussion. If his prognosis is felt to be better by oncology and aggressive care is felt indicated we could consider catheterization and PCI but he would need to be on dual antiplatelet therapy for one month if BMS placed which would also delay his chemotherapy for lung cancer. 2 lung cancer-management per pulmonary and oncology.  3 COPD-management per pulmonary.  4 anemia of chronic  disease-management per pulmomary 5 question atrial fibrillation-I find no rhythm strips at this point to support this. We will follow and adjust therapy if needed.  Olga Millers MD 10/06/2012, 2:07 PM

## 2012-10-06 NOTE — Progress Notes (Signed)
Physician Progress Note and Electrolyte Replacement  Patient Name: [name] [name] DOB: 1956-06-27 MRN: [id number]  Date of Service  10/06/2012   HPI/Events of Note    Recent Labs Lab 10/03/12 1045 10/03/12 1555 10/04/12 0530 10/05/12 1035 10/06/12 0505  NA 135 134* 135 140 141  K 3.4* 3.4* 3.5 3.4* 3.2*  CL 97 96 100 104 105  CO2 24 24 21 26 28   GLUCOSE 123* 168* 208* 132* 97  BUN 6 6 7 9 12   CREATININE 0.84 0.81 0.78 0.68 0.72  CALCIUM 9.6 9.3 9.2 8.7 8.8  MG  --  2.4 2.5  --  2.3  PHOS  --  5.7* 3.8  --  2.8    Estimated Creatinine Clearance: 82.4 ml/min (by C-G formula based on Cr of 0.72).  Intake/Output     03/07 0701 - 03/08 0700 03/08 0701 - 03/09 0700   P.O. 820    I.V. (mL/kg)     NG/GT     Total Intake(mL/kg) 820 (14.2)    Urine (mL/kg/hr) 850 (0.6)    Total Output 850     Net -30          Urine Occurrence 4 x    Stool Occurrence      - I/O DETAILED x 24h      - I/O THIS SHIFT    ASSESSMENT hypokalemia  eICURN Interventions  kcl x 1    ASSESSMENT: MAJOR ELECTROLYTE      Dr. Kalman Shan, M.D., [facility].C.P Pulmonary and Critical Care Medicine Staff Physician [facility] Pager: [phone], If no answer or between  15:00h - 7:00h: call [phone]  10/06/2012 7:05 AM

## 2012-10-06 NOTE — Progress Notes (Signed)
I talked to Dr. Kriste Basque and he recommended against discharge tonight; I informed the patient's wife; she would like to discuss the discharge plan with the rounding physicians tomorrow am; she asked if it would be possible to be called on the cell phone with the rounding time. I will defer to Dr. Kriste Basque and Dr. [name] further discussions with the family.

## 2012-10-06 NOTE — Progress Notes (Signed)
Name: [name] [name] DOB: 01/07/1956 MRN: [id number] PCP: [name], MD ADMIT DATE: 10/03/2012 LOS: 3  PCCM PROGRESS NOTE  Brief Patient Profile:  Smoker with COPD (Fev1 1.6L/44%) severe depression (lost business, separated from wife legally but she is still dpoa). Had inpatient [facility] admission in [place] early 2014 that resulted in discovery of Rt lung mass on CXR. PET scan Feb 2014 showed large carinal area PET hot mass with RB4 node/mass. 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 In both cases, scope was able to pass through distally. Post EBUS had obstructive physiology resp distress post extubation (during case as well EtCo2 not registering well) and had to be reintubated. Admitted to 2100 by Dr [name]  Pmhx : depression, anxiety, cachexa (related to above),. Prostate cancer in2007, hypertension and hyperlipidemia nos, hx of kidney stones, ? Old stroke   Lines / Drains: ETT 10/03/12 for EBUS >10/03/12, 10/03/12 in PACU >>3/6  Micro:   Antibiotics:  Tests / Events: 10/03/2012 >  post ebus failed extubation and admit ICU, 10/04/12 3/8> Review of EKGs w/ marked change from baseline c/w ischemia; pos troponins; abnormal 2DEcho=> cancel planned disch & consult Cards...  Subjective/Overnight/Interval History: No distress but now in AF ww/RVR  3/8> regular rhythm, slow & deliberate w/ his motions, sl slow mentation, wants to go home but needs cardiac eval...  Vital Signs: Temp:  [97.7 F (36.5 C)-98.3 F (36.8 C)] 97.7 F (36.5 C) (03/08 0714) Pulse Rate:  [69-128] 69 (03/08 0714) Resp:  [17-19] 17 (03/08 0714) BP: (90-124)/(48-74) 116/74 mmHg (03/08 0714) SpO2:  [94 %-98 %] 96 % (03/08 0714) Weight:  [57.9 kg (127 lb 10.3 oz)] 57.9 kg (127 lb 10.3 oz) (03/07 2235) I/O last 3 completed shifts: In: 1195 [P.O.:1175;  I.V.:20] Out: 1350 [Urine:1350] Room air   Physical Examination: General: cachectic male. No distress, but "feels terrible" Neuro:  Awake, oriented, weak  HEENT:No JVD  Cardiovascular: sinus tach   Chest: diffuse rhonchi and wheeze.  Abdomen:  Soft, non tender, normal bowel sounds Musculoskeletal:  No cyanosis, no clubbing, no edema Skin:  intact Extremities: no edema  Ventilator settings:    Radiology x 48h  No results found.  Recent Labs Lab 10/04/12 0530 10/05/12 1035 10/06/12 0505  NA 135 140 141  K 3.5 3.4* 3.2*  CL 100 104 105  CO2 21 26 28   BUN 7 9 12   CREATININE 0.78 0.68 0.72  GLUCOSE 208* 132* 97    Recent Labs Lab 10/03/12 1045 10/03/12 1555 10/04/12 0530  HGB 11.7* 11.1* 10.6*  HCT 35.3* 34.5* 32.3*  WBC 6.9 6.6 8.2  PLT 325 331 254    EKG's> prev EKGs w/ NSR, WNL.Marland KitchenMarland Kitchen EKG here 3/6 w/ poor R progression & Twave inversions (similar 3/7)...  2DEcho 3/7> Study Conclusions - Left ventricle: Septal and apical hypokinesis The cavity size was mildly dilated. Wall thickness was normal. Systolic function was mildly to moderately reduced. The estimated ejection fraction was in the range of 40% to 45%. Wall motion was normal; there were no regional wall motion abnormalities. - Left atrium: The atrium was moderately dilated. - Atrial septum: No defect or patent foramen ovale was identified. - Pericardium, extracardiac: A trivial pericardial effusion was identified.   ASSESSMENT AND PLAN  1) Post op resp failure - post-op airway obstruction physiology. 10/03/12- 10/04/12: extubated.  2) COPD (  gold D), possibly mild exacerbation s/p extubation 3) New dx of likely NSCLC - wife knows dx. Patient given dx post extubation Currently on room air=> place on O2 3/8 due to cardiac problem P: Transition to oral pred Resume home BDs Has not been seen by ONC. Not even sure he is a candidate for therapy given functional status. Given discussion w/ patient we will  have palliative care see the patient first before we even call ONC Has ONC f/u   4) Cardiac ischemia; r/o AMI; Abn EKG & 2DEcho- both new> 5) ?AF w/ RVR noticed on monitor.  -  noticed on 10/05/2012. Wife says that several weeks ago similar episode at primary care physician's office her EKG was normal. Currently EEG shows normal sinus rhythm (attending physician statement] P: Cycle CEs => Abnormal troponins 0.36=>0.30 Add low dose CCB (has very poor FEV1 so will avoid BB) Hold off from anticoagulation for now Cont tele Change albut to xopenex  Ck lytes => K=3.2 & supplemented  6) ?Agitated on wua. Better. Now just seems depressed.  7) Has h/o depression/anxiety and recent statements that he did not want to stay in hospital post procedure P: Resume home meds.   8) Anemia of chronic and critical illness  Recent Labs Lab 10/03/12 1045 10/03/12 1555 10/04/12 0530  HGB 11.7* 11.1* 10.6*  HCT 35.3* 34.5* 32.3*  PLT 325 331 254  INR 1.13  --   --   APTT 47*  --   --   P: - PRBC for hgb </= 6.9gm%    - exceptions are   -  if ACS susepcted/confirmed then transfuse for hgb </= 8.0gm%,  or    -  If septic shock first 24h and scvo2 < 70% then transfuse for hgb </= 9.0gm%   - active bleeding with hemodynamic instability, then transfuse regardless of hemoglobin value   At at all times try to transfuse 1 unit prbc as possible with exception of active hemorrhage  9) Hyperglycemia Likely steroid induced. Have cut dosing.  P: Check CBG AC and HS. May need to add insulin  10) Disposition> he is anxious for disch, needs home health, possibly hospice involvement, hold on discharge for Cardiac eval of ischemic dis/ r/o MI...  Patient iniitally said no to chemo/xrt but to attending he says he wants to hear out options, so will cancel palliative care meet for now. Opd fu  DR Arbutus Ped 10/11/12 at 15.45 and then after that with DR [name] < 2-3 weeks (nees to be made)  Wife updated on  phone  Scott M. Kriste Basque, MD 10/06/2012 8:21 AM

## 2012-10-06 NOTE — Progress Notes (Signed)
PT Cancellation Note  Patient Details Name: Aryaan Persichetti MRN: [id number] DOB: [birth year]   Cancelled Treatment:    Reason Eval/Treat Not Completed: Family just arrived with dinner from outside source. Pt wanting to eat. Offered to return later and he was not sure he'd be willing to participate. Will attempt again as schedule permits   SASSER,LYNN 10/06/2012, 4:09 PM Pager [phone]

## 2012-10-07 DIAGNOSIS — I4891 Unspecified atrial fibrillation: Secondary | ICD-10-CM

## 2012-10-07 DIAGNOSIS — Z515 Encounter for palliative care: Secondary | ICD-10-CM

## 2012-10-07 DIAGNOSIS — C3490 Malignant neoplasm of unspecified part of unspecified bronchus or lung: Secondary | ICD-10-CM

## 2012-10-07 DIAGNOSIS — F411 Generalized anxiety disorder: Secondary | ICD-10-CM

## 2012-10-07 LAB — COMPREHENSIVE METABOLIC PANEL
ALT: 13 U/L (ref 0–53)
Alkaline Phosphatase: 79 U/L (ref 39–117)
BUN: 12 mg/dL (ref 6–23)
CO2: 26 mEq/L (ref 19–32)
Chloride: 101 mEq/L (ref 96–112)
GFR calc Af Amer: >90 mL/min (ref >90)
Glucose, Bld: 97 mg/dL (ref 70–99)
Potassium: 3.9 mEq/L (ref 3.5–5.1)
Sodium: 138 mEq/L (ref 135–145)
Total Bilirubin: 0.3 mg/dL (ref 0.3–1.2)
Total Protein: 6.5 g/dL (ref 6.0–8.3)

## 2012-10-07 LAB — CBC
HCT: 36.2 % — ABNORMAL LOW (ref 39.0–52.0)
Hemoglobin: 11.5 g/dL — ABNORMAL LOW (ref 13.0–17.0)
MCHC: 31.8 g/dL (ref 30.0–36.0)
RBC: 4.2 MIL/uL — ABNORMAL LOW (ref 4.22–5.81)
WBC: 7.5 10*3/uL (ref 4.0–10.5)

## 2012-10-07 LAB — GLUCOSE, CAPILLARY: Glucose-Capillary: 113 mg/dL — ABNORMAL HIGH (ref 70–99)

## 2012-10-07 MED ORDER — METOPROLOL TARTRATE 25 MG PO TABS: 25.0000 mg | ORAL_TABLET | Freq: Two times a day (BID) | ORAL | Status: DC

## 2012-10-07 MED ORDER — PREDNISONE 20 MG PO TABS: ORAL_TABLET | ORAL | Status: DC

## 2012-10-07 MED ORDER — ASPIRIN 81 MG PO TBEC: 81.0000 mg | DELAYED_RELEASE_TABLET | Freq: Every day | ORAL | Status: DC

## 2012-10-07 MED ORDER — ATORVASTATIN CALCIUM 40 MG PO TABS: 40.0000 mg | ORAL_TABLET | Freq: Every day | ORAL | Status: DC

## 2012-10-07 MED ORDER — METOPROLOL TARTRATE 25 MG PO TABS
25.0000 mg | ORAL_TABLET | Freq: Two times a day (BID) | ORAL | Status: DC
Start: 2012-10-07 — End: 2012-10-07
  Administered 2012-10-07: 25 mg via ORAL
  Filled 2012-10-07 (×2): qty 1

## 2012-10-07 NOTE — Progress Notes (Addendum)
   Subjective:  Denies CP or dyspnea   Objective:  Filed Vitals:   10/06/12 1307 10/06/12 1320 10/06/12 1855 10/06/12 2130  BP:  125/82 116/78 117/65  Pulse:  96 80 77  Temp:  98.1 F (36.7 C) 98.2 F (36.8 C) 98.4 F (36.9 C)  TempSrc:  Oral Oral Oral  Resp:  24 18   Height:      Weight:    122 lb 12.7 oz (55.7 kg)  SpO2: 93% 95% 97% 94%    Intake/Output from previous day:  Intake/Output Summary (Last 24 hours) at 10/07/12 1610 Last data filed at 10/07/12 0600  Gross per 24 hour  Intake    480 ml  Output      0 ml  Net    480 ml    Physical Exam: Physical exam: Well-developed well-nourished in no acute distress.  Skin is warm and dry.  HEENT is normal.  Neck is supple. No thyromegaly.  Chest with dimished BS throughout Cardiovascular exam is regular rate and rhythm.  Abdominal exam nontender or distended. No masses palpated. Extremities show no edema. neuro grossly intact    Lab Results: Basic Metabolic Panel:  Recent Labs  96/04/54 0505 10/07/12 0530  NA 141 138  K 3.2* 3.9  CL 105 101  CO2 28 26  GLUCOSE 97 97  BUN 12 12  CREATININE 0.72 0.67  CALCIUM 8.8 8.9  MG 2.3  --   PHOS 2.8  --    CBC:  Recent Labs  10/07/12 0530  WBC 7.5  HGB 11.5*  HCT 36.2*  MCV 86.2  PLT 309   Cardiac Enzymes:  Recent Labs  10/05/12 1035 10/05/12 1515 10/05/12 2051  TROPONINI 0.36* 0.30* 0.30*     Assessment/Plan:  1 non-ST elevation myocardial infarction-the patient has had no chest pain. As outlined previously this is a complicated situation. Discussions are ongoing concerning management of his lung cancer and aggressiveness of care. If he agrees to chemotherapy, thrombocytopenia is likely. He is also having hemoptysis. Any intervention would require anticoagulation with at least short-term duel antiplatelet therapy. Given these issues I would favor medical therapy with aspirin, Lopressor and statin for now. If his prognosis is felt to be better by  oncology/pulmonary and aggressive care is felt indicated we could consider catheterization and PCI but he would need to be on dual antiplatelet therapy for one month if BMS placed which would delay his chemotherapy for lung cancer. 2 lung cancer-management per pulmonary and oncology. 3 atrial fibrillation/flutter-telemetry is reviewed and the patient is having bouts of atrial fibrillation and flutter with a rapid ventricular response. Increase metoprolol to 25 mg by mouth twice a day. Continue aspirin. I would not add Coumadin given his ongoing hemoptysis. Check TSH.    Olga Millers 10/07/2012, 9:07 AM

## 2012-10-07 NOTE — Progress Notes (Addendum)
Patient ZO:XWRUEAV Sibal      DOB: 03/23/1956      WUJ:[id number]  Summary of Goals of Care, full note to follow  Met with the patient , his x wife, and his eldest daughter.   Chip's biggest concern is that he could not have all the information he wanted at one time.  He has obvious cognitive deficits and mental health issues that likely impair his judgment and insight but may not necessarily impair his capacity.  His ex wife is trying to help as much as she can.  I expressed by concern to the patient that first and foremost he needed to be compliant with his medications, and his follow up appointments. Second , I ask him to continue with his mental health visits, third I told him that he should not wait to tell someone he is feeling funny or bad especially since he did not have symptoms with his MI.  One of his biggest worries was that he stated "someone told me I need hospice"   .  Evidently, his parents both volunteered for [facility] and his Dad died of Cancer in the Hospice home.  I told chip honestly that he was sick enough to die in the near future and that Hospice involvement early in his outpatient course when he was ready or when medical treatment no longer would provide quality of life, that they would be my first choice for taking good care of him.    We started to talk about his code status and I left a MOST form with his ex wife.  He did not want to engage at this time but he at least let me tell him why it would be good to relook at his status with Dennie Bible because of his heart disease and his cancer.    I will ask Kathrin Penner , LCSW at the cancer center to hook up with this family to try to add an additional layer of help.  He might need to consider getting his treatments at [place] instead of [facility] as he lives in [place] and Dennie Bible would have to go get him and take him back because she lives in  [place].    We talked about trying to find some small amount of meaning each  day   I am concerned about him being found down as he live at his mother's home alone and generally will not answer the phone.  Dennie Bible has arranged for [facility] to come in and she has a neighbor who can check on him.  This is not optimal but the best that can be done in light of his desire to make choices for himself.   Recommendations:  1.  Cough:   Consider adding mucolytics at discharge and continuing neb treatments at home. 2.  Depression: patient in a program but will need positive reinforcement to continue therapy and meds 3.  I will check with [facility] regarding their supportive care program and see if we can get him further support.    Total time: 1130 am-1245 pm   Melissa L. Ladona Ridgel, MD MBA The Palliative Medicine Team at [facility] Phone: [phone] Pager: [phone]

## 2012-10-07 NOTE — Progress Notes (Signed)
PT Cancellation Note  Patient Details Name: [name] [name] MRN: [id number] DOB: December 30, 1955   Cancelled Treatment:    Reason Eval/Treat Not Completed: Patient at procedure or test/unavailable: pt having Palliative Care meeting. Will f/u 3/10 after goals of care established.   SASSER,LYNN 10/07/2012, 12:07 PM Pager [phone]

## 2012-10-07 NOTE — Progress Notes (Signed)
Name: [name] [name] DOB: Jan 07, 1956 MRN: [id number] PCP: [name], MD ADMIT DATE: 10/03/2012 LOS: 4  PCCM PROGRESS NOTE  Brief Patient Profile:  Smoker with COPD (Fev1 1.6L/44%) severe depression (lost business, separated from wife legally but she is still dpoa). Had inpatient [facility] admission in [place] early 2014 that resulted in discovery of Rt lung mass on CXR. PET scan Feb 2014 showed large carinal area PET hot mass with RB4 node/mass. 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 In both cases, scope was able to pass through distally. Post EBUS had obstructive physiology resp distress post extubation (during case as well EtCo2 not registering well) and had to be reintubated. Admitted to 2100 by Dr [name]  Pmhx : depression, anxiety, cachexa (related to above),. Prostate cancer in2007, hypertension and hyperlipidemia nos, hx of kidney stones, ? Old stroke   Lines / Drains: ETT 10/03/12 for EBUS >10/03/12, 10/03/12 in PACU >>3/6  Micro:   Antibiotics:  Tests / Events: 10/03/2012 >  post ebus failed extubation and admit ICU, 10/04/12 3/8> Review of EKGs w/ marked change from baseline c/w ischemia; pos troponins; abnormal 2DEcho=> cancel planned disch & consult Cards...  Subjective/Overnight/Interval History: No distress but now in AF ww/RVR  3/8> regular rhythm, slow & deliberate w/ his motions, sl slow mentation, wants to go home but needs cardiac eval...  Vital Signs: Temp:  [98.1 F (36.7 C)-98.4 F (36.9 C)] 98.4 F (36.9 C) (03/08 2130) Pulse Rate:  [77-96] 77 (03/08 2130) Resp:  [18-24] 18 (03/08 1855) BP: (116-125)/(65-82) 117/65 mmHg (03/08 2130) SpO2:  [93 %-97 %] 94 % (03/08 2130) Weight:  [55.7 kg (122 lb 12.7 oz)] 55.7 kg (122 lb 12.7 oz) (03/08 2130) I/O last 3 completed shifts: In: 960 [P.O.:960] Out: 700  [Urine:700] Room air   Physical Examination: General: cachectic male. No distress, but "feels terrible" Neuro:  Awake, oriented, weak  HEENT:No JVD  Cardiovascular: sinus tach   Chest: diffuse rhonchi and wheeze.  Abdomen:  Soft, non tender, normal bowel sounds Musculoskeletal:  No cyanosis, no clubbing, no edema Skin:  intact Extremities: no edema  Ventilator settings:    Radiology x 48h  No results found.  Recent Labs Lab 10/05/12 1035 10/06/12 0505 10/07/12 0530  NA 140 141 138  K 3.4* 3.2* 3.9  CL 104 105 101  CO2 26 28 26   BUN 9 12 12   CREATININE 0.68 0.72 0.67  GLUCOSE 132* 97 97    Recent Labs Lab 10/03/12 1555 10/04/12 0530 10/07/12 0530  HGB 11.1* 10.6* 11.5*  HCT 34.5* 32.3* 36.2*  WBC 6.6 8.2 7.5  PLT 331 254 309    EKG's> prev EKGs w/ NSR, WNL.Marland KitchenMarland Kitchen EKG here 3/6 w/ poor R progression & Twave inversions (similar 3/7)...  2DEcho 3/7> Study Conclusions - Left ventricle: Septal and apical hypokinesis The cavity size was mildly dilated. Wall thickness was normal. Systolic function was mildly to moderately reduced. The estimated ejection fraction was in the range of 40% to 45%. Wall motion was normal; there were no regional wall motion abnormalities. - Left atrium: The atrium was moderately dilated. - Atrial septum: No defect or patent foramen ovale was identified. - Pericardium, extracardiac: A trivial pericardial effusion was identified.   ASSESSMENT AND PLAN  1) Post op resp failure - post-op airway obstruction physiology. 10/03/12- 10/04/12: extubated.  2) COPD (gold  D), possibly mild exacerbation s/p extubation 3) New dx of NSCLC - wife knows dx. Patient given dx post extubation Currently on room air=> place on O2 3/8 due to cardiac problem P: Transition to oral pred Resume home BDs Has not been seen by ONC. Not even sure he is a candidate for therapy given functional status. Given discussion w/ patient we will have palliative care see the  patient first before we even call ONC Has ONC f/u   4) Cardiac ischemia; r/o AMI; Abn EKG & 2DEcho- both new> 5) ?AF w/ RVR noticed on monitor => DrCrenshaw confirmed PAF episode on monitor P: Cycle CEs => Abnormal troponins 0.36=>0.30 Add low dose CCB (has very poor FEV1 so will avoid BB) Hold off from anticoagulation for now Cont tele Change albut to xopenex  Ck lytes => K=3.2 & supplemented Apprec cardiology consult from DrCrenshaw  6) ?Agitated on wua. Better. Now just seems depressed.  7) Has h/o depression/anxiety and recent statements that he did not want to stay in hospital post procedure P: Resume home meds.   8) Anemia of chronic and critical illness  Recent Labs Lab 10/03/12 1045 10/03/12 1555 10/04/12 0530 10/07/12 0530  HGB 11.7* 11.1* 10.6* 11.5*  HCT 35.3* 34.5* 32.3* 36.2*  PLT 325 331 254 309  INR 1.13  --   --   --   APTT 47*  --   --   --   P: - PRBC for hgb </= 6.9gm%    - exceptions are   -  if ACS susepcted/confirmed then transfuse for hgb </= 8.0gm%,  or    -  If septic shock first 24h and scvo2 < 70% then transfuse for hgb </= 9.0gm%   - active bleeding with hemodynamic instability, then transfuse regardless of hemoglobin value   At at all times try to transfuse 1 unit prbc as possible with exception of active hemorrhage  9) Hyperglycemia Likely steroid induced. Have cut dosing.  P: Check CBG AC and HS. May need to add insulin  10) Disposition> He refuses to stay any longer- refuses to stay & see Oncology / Pulm/ Cards/ Hospice together for treatment planning... Wife will meet w/ DrTaylor from Palliative care today & they will line up further outpt eval & Rx...   Lonzo Cloud. Kriste Basque, MD 10/07/2012 10:30 AM

## 2012-10-07 NOTE — Consult Note (Signed)
Patient [name] [name]      DOB: 1955-09-03      WUJ:[id number]     Consult Note from the Palliative Medicine Team at [facility]    Consult Requested by: Dr. [name]     PCP: [name], MD Reason for Consultation: Goals of care    Phone Number:[phone]  Assessment of patients Current state: Patient is a 57 year old white male with a known past medical history for mental health issues including depression, history of stroke with cognitive changes post event, prostate cancer, found to have a lung mass which underwent biopsy. Course was complicated by inability to liberate from the ventilator. Subsequently patient was identified as having a myocardial infarction. Significant coronary artery disease is suspected. 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 The patient's course is complicated by the fact that his depression which causes self neglect and poor judgment and insight. The patient however does have capacity for decision making in that he understands the implications of taking versus not taking therapy for his conditions. He states that he just needs information. I met with the patient his ex-wife who assists him with decision-making and his daughter. We talked through the diagnoses that he's received recently and the emotions that are attached to those. We talked about what would be needed in order to assure that he has all the information that he needs which would mean that he does attend his doctor's meetings next week. I encouraged him to maintain the outpatient psychiatric program that he has been in order to allow him to have the strength to him to wear these other medical diagnoses. We encouraged him to please identify when he is not feeling well early as is he is at high risk for sudden death related to his heart. [name] [name] was able to verbalize his concern about his health when he asked "somebody said something about hospice". It he started to cry. We talked  about how hospice can be a 2 for him down the road but right now further information from the cancer doctors and cardiologist would be needed to assure that he has quality and dignity in his life. Emotional support was offered. We started to talk about his advanced directives and CODE STATUS. He would not engage in this conversation stating he's not ready for that. At this time he remains a full code but a most form was supplied to his ex-wife which can be filled out by any one of his doctors after he makes decisions for himself.  Goals of Care: 1.  Code Status: Full CODE STATUS   2. Scope of Treatment: At this time the patient states that he will participate in followup appointments with oncology. His initial appointment is in [place] however it may be more convenient for him to have his treatment and followup in [place] as his ex-wife needs to come and go in order to make sure he gets to his appointments. I will asked the primary team oncologist to consider this at their first meeting. Patient also agrees to followup with cardiology and take his medications as indicated. He understands that he is at high risk for sudden cardiac death, especially if he does not comply with treatment.  4. Disposition: The patient is requesting to discharge to home. His ex-wife has arrange for some followup with an outside agency who will check on him. They also have a neighbor who can check in on him. His ex-wife will assist him with visits to the psychiatry group  and to the other physicians   3. Symptom Management:   1. Anxiety/Agitation: The patient should maintain his psychiatric regime as prescribed which include Abilify, Remeron and Vistaril. 2. Myocardial infarction without pain. The patient was encouraged to take his aspirin, statin, and beta blocker we described what each of these does as he is skeptical about the reason for the medications.  4. 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 Subsequently he had a stroke in his wife states he was never the same after that. He had been married but is now divorced his ex-wife is helping him with his decisions. He has a son and daughter generally he is described as a person who prefers to be alone. He lives in his mother's home. She is still living but is in memory care. The patient prefers not to answer the phone which we encouraged him not to do since he needs folks to help take care of him  5. Spiritual: We were unable to explore his spiritual care at this time I would encourage the social worker and chaplain at the cancer center to become involved in his care I will communicate with them on Monday       Patient Documents Completed or Given: Document Given Completed  Advanced Directives Pkt    MOST    DNR    Gone from My Sight    Hard Choices      Brief HPI: Patient is a 57 year old white male with a known history of a lung mass. He had a bronchoscopy for identification of tissue, this procedure was complicated by inability to extubate. He subsequently was extubated and then was found to have an irregular heartbeat which was associated with a non-ST elevation MI.   ROS: Cough with thick mucoid necrotic material, decreased appetite, depression    PMH:  Past Medical History  Diagnosis Date  . Prostate cancer 10/2005    Dr. Achilles Dunk [phone])  . Impaired glucose tolerance   . HLD (hyperlipidemia)   . HTN (hypertension) 1992  . BPH (benign prostatic hypertrophy)   . History of kidney stones   . Anxiety   . CVA (cerebral infarction) 07/2009  . Depression   . Shortness of breath     at rest, heavy smoker     PSH: Past Surgical History  Procedure Laterality Date  . Ureterscopic stone removal  09/2002  . Lung biopsy  01/2004    neg cancer  . Rectal examination under anesthesia w/ cystoscopy  09/2005    BPH only  . Hifu  12/2005     prostate cancer  . Lih  09/2008    Dr. Achilles Dunk  . Video bronchoscopy with endobronchial ultrasound N/A 10/03/2012    Procedure: VIDEO BRONCHOSCOPY WITH ENDOBRONCHIAL ULTRASOUND;  Surgeon: Kalman Shan, MD;  Location: [facility] OR;  Service: Cardiopulmonary;  Laterality: N/A;   I have reviewed the FH and SH and  If appropriate update it with new information. Allergies  Allergen Reactions  . Erythromycin Ethylsuccinate     REACTION: unspecified  . Sildenafil     REACTION: just didn't tolerate   Scheduled Meds: Continuous Infusions: PRN Meds:.      BP 117/72  Pulse 75  Temp(Src) 98.2 F (36.8 C) (Oral)  Resp 18  Ht 5\' 4"  (1.626 m)  Wt 55.7 kg (122 lb 12.7 oz)  BMI 21.07 kg/m2  SpO2 97%   PPS: 40-50%   Intake/Output  Summary (Last 24 hours) at 10/08/12 0736 Last data filed at 10/07/12 0900  Gross per 24 hour  Intake      0 ml  Output    200 ml  Net   -200 ml     Physical Exam:  General: Ill-appearing disheveled gentleman in no acute distress HEENT:  Pupils are equal round and reactive to light Chest:   Coarse rhonchi at right greater than left no wheezing CVS: Irregularly irregular positive S1 and S2 no S3-S4  Abdomen:scaphoid soft nontender nondistended Ext: Thin no lesions Neuro: Is flat with intermittent tearfulness memory is decreased Labs: CBC    Component Value Date/Time   WBC 7.5 10/07/2012 0530   RBC 4.20* 10/07/2012 0530   HGB 11.5* 10/07/2012 0530   HCT 36.2* 10/07/2012 0530   PLT 309 10/07/2012 0530   MCV 86.2 10/07/2012 0530   MCH 27.4 10/07/2012 0530   MCHC 31.8 10/07/2012 0530   RDW 15.8* 10/07/2012 0530   LYMPHSABS 1.0 09/01/2009 1138   MONOABS 0.4 09/01/2009 1138   EOSABS 0.1 09/01/2009 1138   BASOSABS 0.0 09/01/2009 1138      CMP     Component Value Date/Time   NA 138 10/07/2012 0530   K 3.9 10/07/2012 0530   CL 101 10/07/2012 0530   CO2 26 10/07/2012 0530   GLUCOSE 97 10/07/2012 0530   BUN 12 10/07/2012 0530   CREATININE 0.67 10/07/2012 0530   CALCIUM 8.9 10/07/2012 0530    PROT 6.5 10/07/2012 0530   ALBUMIN 2.7* 10/07/2012 0530   AST 21 10/07/2012 0530   ALT 13 10/07/2012 0530   ALKPHOS 79 10/07/2012 0530   BILITOT 0.3 10/07/2012 0530   GFRNONAA >90 10/07/2012 0530   GFRAA >90 10/07/2012 0530    Chest Xray Reviewed/Impressions: Right-sided masses in the hilar area     Time In Time Out Total Time Spent with Patient Total Overall Time   11:30 AM   12:45 PM 75 minutes   75 minutes     Greater than 50%  of this time was spent counseling and coordinating care related to the above assessment and plan.    Melissa L. Ladona Ridgel, MD MBA The Palliative Medicine Team at [facility] Phone: [phone] Pager: [phone]

## 2012-10-08 LAB — TSH: TSH: 1.444 u[IU]/mL (ref 0.350–4.500)

## 2012-10-11 ENCOUNTER — Ambulatory Visit (HOSPITAL_BASED_OUTPATIENT_CLINIC_OR_DEPARTMENT_OTHER): Payer: Self-pay | Admitting: Internal Medicine

## 2012-10-11 ENCOUNTER — Encounter: Payer: Self-pay | Admitting: Internal Medicine

## 2012-10-11 ENCOUNTER — Encounter: Payer: Self-pay | Admitting: *Deleted

## 2012-10-11 ENCOUNTER — Ambulatory Visit
Admission: RE | Admit: 2012-10-11 | Discharge: 2012-10-11 | Disposition: A | Discharge status: Home / Self Care | Payer: Self-pay | Source: Ambulatory Visit | Attending: Radiation Oncology | Admitting: Radiation Oncology

## 2012-10-11 ENCOUNTER — Encounter: Payer: Self-pay | Admitting: Radiation Oncology

## 2012-10-11 VITALS — BP 99/63 | HR 65 | Temp 97.5°F | Resp 18 | Ht 68.0 in | Wt 121.0 lb

## 2012-10-11 DIAGNOSIS — R590 Localized enlarged lymph nodes: Secondary | ICD-10-CM

## 2012-10-11 DIAGNOSIS — C342 Malignant neoplasm of middle lobe, bronchus or lung: Secondary | ICD-10-CM

## 2012-10-11 DIAGNOSIS — C3491 Malignant neoplasm of unspecified part of right bronchus or lung: Secondary | ICD-10-CM

## 2012-10-11 DIAGNOSIS — Z87891 Personal history of nicotine dependence: Secondary | ICD-10-CM

## 2012-10-11 DIAGNOSIS — R042 Hemoptysis: Secondary | ICD-10-CM

## 2012-10-11 DIAGNOSIS — C3400 Malignant neoplasm of unspecified main bronchus: Secondary | ICD-10-CM

## 2012-10-11 DIAGNOSIS — R0602 Shortness of breath: Secondary | ICD-10-CM

## 2012-10-11 NOTE — Care Management Note (Signed)
Late entry for ongoing followup,     CARE MANAGEMENT NOTE 10/11/2012  Patient:  [name] [name],[name] [name]   Account Number:  [id number]  Date Initiated:  10/04/2012  Documentation initiated by:  [facility]  Subjective/Objective Assessment:   Reintubated post bronch on 10-03-12.  Lives alone, has wife but legally separated.     Action/Plan:   3/7 Order for [facility] however, also noted plan for Palliative consult, will await Palliative recommendations before arranging [facility] as pt may be appropriate for Home Hospice Services.   Anticipated DC Date:  10/11/2012   Anticipated DC Plan:  HOME W HOME HEALTH SERVICES  In-house referral  Clinical Social Worker      DC Planning Services  CM consult      Choice offered to / List presented to:             Status of service:  In process, will continue to follow Medicare Important Message given?   (If response is "NO", the following Medicare IM given date fields will be blank) Date Medicare IM given:   Date Additional Medicare IM given:    Discharge Disposition:    Per UR Regulation:  Reviewed for med. necessity/level of care/duration of stay  If discussed at Long Length of Stay Meetings, dates discussed:    Comments:  ContactDemontrae, [place] [phone]   [phone]  10/11/2012 Still no respone from pt or family re HH needs will close record. Johny Shock RN MPH  10/08/2012 Noted that pt d/c on 10/07/2012 , noted that Pallative Care met with pt however Hospice Home Services not recommended at this time. HH may not have been arranged. Call placed to pt home re [facility] needs, no answer so message left asking family to return case manager call re [facility] needs. CRoyal RN MPH Case Manager

## 2012-10-11 NOTE — Progress Notes (Signed)
Radiation Oncology         [phone]) [phone] ________________________________  Initial outpatient Consultation  Name: [name] [name] MRN: [id number]  Date: 10/11/2012  DOB: 01-03-1956  EX:[id number] Alphonsus Sias, MD  Kalman Shan, MD   REFERRING PHYSICIAN: Kalman Shan, MD  DIAGNOSIS: The primary encounter diagnosis was Malignant neoplasm middle lobe, bronchus or lung. Diagnoses of Adenopathy, hilar, Mediastinal adenopathy, and Squamous cell lung cancer, right were also pertinent to this visit.  HISTORY OF PRESENT ILLNESS::[name] [name] is a 57 y.o. male who is seen out of the courtesy of Dr. [name] as part of the multidisciplinary thoracic oncology clinic. The patient was recently found to have a locally advanced non-small cell lung cancer presenting in the right hilar and subcarinal region. This mass did extend across midline into the left mainstem bronchus. There was associated this structure consolidation and nodularity within the right middle lobe and right lower lobe. Biopsy of the lung area,  carinal region revealed invasive squamous cell carcinoma. The patient is not felt to be a candidate for surgical intervention given his medical situation and stage of disease.   PREVIOUS RADIATION THERAPY: Yes for management of prostate cancer. The patient was treated at the [facility]  PAST MEDICAL HISTORY:  has a past medical history of Prostate cancer (10/2005); Impaired glucose tolerance; HLD (hyperlipidemia); HTN (hypertension) (1992); BPH (benign prostatic hypertrophy); History of kidney stones; Anxiety; CVA (cerebral infarction) (07/2009); Depression; and Shortness of breath.    PAST SURGICAL HISTORY: Past Surgical History  Procedure Laterality Date  . Ureterscopic stone removal  09/2002  . Lung biopsy  01/2004    neg cancer  . Rectal examination under anesthesia w/ cystoscopy  09/2005    BPH only  . Hifu  12/2005    prostate cancer  . Lih  09/2008    Dr. Achilles Dunk   . Video bronchoscopy with endobronchial ultrasound N/A 10/03/2012    Procedure: VIDEO BRONCHOSCOPY WITH ENDOBRONCHIAL ULTRASOUND;  Surgeon: Kalman Shan, MD;  Location: [facility] OR;  Service: Cardiopulmonary;  Laterality: N/A;    FAMILY HISTORY: family history includes Brain cancer in his father; Coronary artery disease in his father; Dementia in his mother; Diabetes in his mother; and Hypertension in his mother.  SOCIAL HISTORY:  reports that he has been smoking Cigarettes.  He has a 42 pack-year smoking history. He does not have any smokeless tobacco history on file. He reports that  drinks alcohol. He reports that he does not use illicit drugs.  ALLERGIES: Erythromycin ethylsuccinate and Sildenafil  MEDICATIONS:  Current Outpatient Prescriptions  Medication Sig Dispense Refill  . albuterol-ipratropium (COMBIVENT) 18-103 MCG/ACT inhaler Inhale 1 puff into the lungs 4 (four) times daily.      . ARIPiprazole (ABILIFY) 10 MG tablet Take 10 mg by mouth daily.      Marland Kitchen aspirin EC 81 MG EC tablet Take 1 tablet (81 mg total) by mouth daily.      Marland Kitchen atorvastatin (LIPITOR) 40 MG tablet Take 1 tablet (40 mg total) by mouth daily at 6 PM.  30 tablet  3  . metoprolol tartrate (LOPRESSOR) 25 MG tablet Take 1 tablet (25 mg total) by mouth 2 (two) times daily.  60 tablet  3  . mirtazapine (REMERON) 45 MG tablet Take 45 mg by mouth at bedtime.      . predniSONE (DELTASONE) 20 MG tablet 2 tabs daily for 4 days, then 1 tab daily for 4 days, then 1/2 tab daily for 4 days then stop  14 tablet  0  . traZODone (DESYREL) 50 MG tablet Take 50 mg by mouth at bedtime.       No current facility-administered medications for this encounter.    REVIEW OF SYSTEMS:  A 15 point review of systems is documented in the electronic medical record. This was obtained by the nursing staff. However, I reviewed this with the patient to discuss relevant findings and make appropriate changes.  He denies any significant pain in the chest.  He does have some mild hemoptysis. He denies any new bony pain headaches dizziness or blurred vision.   PHYSICAL EXAM:  height is 5\' 8"  (1.727 m) and weight is 121 lb (54.885 kg). His oral temperature is 97.5 F (36.4 C). His blood pressure is 99/63 and his pulse is 65. His respiration is 18 and oxygen saturation is 98%.  he is accompanied by his ex-wife on evaluation today. She answers most of the questions. the patient is somewhat disheveled.  The neck exam is free of adenopathy. The axillary and supraclavicular regions are free of adenopathy the lungs are clear to auscultation. The heart has a regular rhythm and rate. The abdomen is soft and nontender with normal bowel sounds.   LABORATORY DATA:  disheveled Lab Results  Component Value Date   WBC 7.5 10/07/2012   HGB 11.5* 10/07/2012   HCT 36.2* 10/07/2012   MCV 86.2 10/07/2012   PLT 309 10/07/2012   Lab Results  Component Value Date   NA 138 10/07/2012   K 3.9 10/07/2012   CL 101 10/07/2012   CO2 26 10/07/2012   Lab Results  Component Value Date   ALT 13 10/07/2012   AST 21 10/07/2012   ALKPHOS 79 10/07/2012   BILITOT 0.3 10/07/2012     RADIOGRAPHY: Dg Chest 2 View  10/03/2012  *RADIOLOGY REPORT*  Clinical Data: Preop bronchoscopy/EBUS  CHEST - 2 VIEW  Comparison: PET CT dated 09/17/2012  Findings: Opacity in the right perihilar/infrahilar region, related to known mass and postobstructive opacity.  Left lung essentially clear. No pleural effusion or pneumothorax.  The heart is normal in size.  Mild degenerative changes of the visualized thoracolumbar spine.  IMPRESSION: Opacity in the right perihilar/infrahilar region, related to known mass and postobstructive opacity.   Original Report Authenticated By: Charline Bills, M.D.    Nm Pet Image Initial (pi) Skull Base To Thigh  09/17/2012  *RADIOLOGY REPORT*  Clinical Data: Subsequent treatment strategy for lung mass.  NUCLEAR MEDICINE PET SKULL BASE TO THIGH  Fasting Blood Glucose:  110  Technique:  16.6  mCi F-18 FDG was injected intravenously. CT data was obtained and used for attenuation correction and anatomic localization only.  (This was not acquired as a diagnostic CT examination.) Additional exam technical data entered on technologist worksheet.  Comparison:  02/23/2004.  Findings:  Neck: No hypermetabolic lymph nodes in the neck.  CT images show no acute findings.  Chest:  There are hypermetabolic lymph nodes in the mediastinum and right hilum.  Lymph nodes extend superiorly to the prevascular space (image 81).  Right hilar/subcarinal nodal mass is somewhat difficult to accurately measure without IV contrast but measures approximately 3.6 x 5.5 cm and has an S U V max of 26.2.  It obstructs the bronchus intermedius and extends across the carina to the proximal left mainstem bronchus.  There is postobstructive collapse/consolidation and nodularity in the right middle and right lower lobes.  Focal hypermetabolism associated with a nodular density in the superior segment right lower lobe which measures  approximately 11 mm (CT image 93), with an S U V max of 4.2.  No additional areas of abnormal hypermetabolism are seen in the chest.  CT images show no pericardial or pleural effusion.  Coronary artery calcification.  Abdomen/Pelvis:  No abnormal hypermetabolic activity within the liver, pancreas, adrenal glands or spleen.  No hypermetabolic lymph nodes.  CT images show no acute findings. Punctate stone is seen in the right kidney.  No free fluid.  Skeleton:  No focal hypermetabolic activity to suggest skeletal metastasis.  IMPRESSION:  1.  Right hilar/subcarinal mass obstructs the bronchus intermedius and extends across the midline into the left mainstem bronchus. Associated postobstructive consolidation and nodularity in the right middle lobe and right lower lobe.  Hypermetabolic mediastinal adenopathy extends to the prevascular space.  Findings are most consistent with primary bronchogenic carcinoma.  2.   Focal hypermetabolism within a nodular density in the right lower lobe may be post obstructive in etiology.  Difficult to exclude a primary lesion if  histology is consistent with small cell carcinoma. 3.  No evidence of distant metastatic disease. 4.  Punctate right renal stone.   Original Report Authenticated By: Leanna Battles, M.D.    Dg Chest Port 1 View  10/03/2012  *RADIOLOGY REPORT*  Clinical Data: Evaluate endotracheal tube placement.  PORTABLE CHEST - 1 VIEW  Comparison: Chest x-ray 10/02/2012.  Findings: An endotracheal tube is in place with tip 5.9 cm above the carina.  Persistent and increasing opacity in the medial aspect of the right hemithorax likely to reflect worsening postobstructive atelectasis and/or consolidation, in the setting of a known perihilar mass.  Lungs otherwise appear clear.  No definite pleural effusions.  Probable skin fold artifact in the right apex.  No evidence of pulmonary edema.  Heart size is within normal limits. Mediastinal contours are otherwise unremarkable.  IMPRESSION: 1.  Tip of endotracheal tube is 5.9 cm above the carina. 2.  Increasing opacity in the right middle lobe likely reflects slight worsening of postobstructive changes related to known right perihilar mass.   Original Report Authenticated By: Trudie Reed, M.D.       IMPRESSION:  locally advanced non-small cell lung cancer. The patient is undecided at this time which treatment approachshe will proceed with.  he may likely to proceed with hospice and no intervention. If the patient does proceed with radiation therapy as part of his overall management he does wish to be treated in the [place] area.     ------------------------------------------------  -----------------------------------  Billie Lade, PhD, MD

## 2012-10-11 NOTE — Progress Notes (Signed)
Spoke with pt and wife at [facility] today.  Educational information given and explained on lung cancer and smoking cessation.  Pt spoke with Child psychotherapist at [facility] had some concerns he wanted to address.  I will follow up with pt and wife after they decide on treatment.

## 2012-10-11 NOTE — Progress Notes (Signed)
[facility] Clinical Social Work  Clinical Social Work met with patient, patient's wife, Systems developer, and thoracic navigator at [employer] today.  Patient "[name] [name]" verbalized understanding regarding his diagnosis, but spoke minimally during visit.  Patient was accompanied by his wife, they are currently separated, however, she identifies herself as his primary caregiver.  Medical oncologist reviewed recommendation for chemoradiation.  Patient hesitant to begin treatment and would like time to process the information and make a decision.  After medical oncologist visit, CSW reviewed patient's options and discussed patient's mental health condition.  [name] [name] has difficulty identifying when he was last seen for psych treatment, however, pt's wife states he has appointment scheduled in April.  CSW strongly encouraged patient to keep appointments for mental health treatment.  CSW will follow pt if he decides to continue with treatment at [facility].  CSW also encouraged patient's wife to contact CSW to discuss further after having time to process information discussed today.  Kathrin Penner, MSW, LCSW Clinical Social Worker [facility] [phone]

## 2012-10-12 ENCOUNTER — Inpatient Hospital Stay: Payer: Self-pay | Admitting: Internal Medicine

## 2012-10-12 ENCOUNTER — Encounter: Payer: Self-pay | Admitting: *Deleted

## 2012-10-13 NOTE — Progress Notes (Signed)
[facility] [phone]   [phone]  CONSULT NOTE  REFERRING PHYSICIAN: Dr. [name].  REASON FOR CONSULTATION:  57 years old white male diagnosed with lung cancer.  HPI Churchill Grimsley is a 57 y.o. male was past medical history significant for severe depression, history of prostate cancer diagnosed in 2007 with recurrence in 2000 and treated at [facility], history of hypertension, anxiety, stroke in 2010 as well as long history of smoking. The patient has been complaining of increasing shortness of breath and cough productive of sputum and small amount of hemoptysis started in January of 2014. He was found in November of 2013 to have new nodular opacity in the right midlung zone on chest x-ray. CT scan of the chest performed at [facility] on 08/24/2012 showed right bronchial obstruction which appears to extend into the right middle lobe and right lower lobe bronchi. There was a possible mass in the right lower lobe measuring 1.3 x 0.93 CM with right hilar adenopathy. CT of the head also performed at [facility] on 08/08/2012 for evaluation of fatigue showed no evidence of an acute ischemic or hemorrhagic infarction. The patient was seen by Dr. [name] and a PET scan was performed on 09/17/2012 and it showedright hilar/subcarinal mass or gastritis the bronchus intermedius and extends across the midline to the left mainstem bronchus associated with postobstructive consolidation and nodularity in the right middle lobe and right lower lobe. There was also hypermetabolic mediastinal adenopathy extending to the prevascular space and the finding were most consistent with primary bronchogenic carcinoma. There was focal hypermetabolism within a nodular density in the right lower lobe that may be postobstructive in etiology but malignancy cannot be excluded. There was no evidence of distant metastatic disease. On 10/03/2012 the patient  underwent flexible bronchoscopy with endobronchial biopsies of the carina mass and endobronchial ultrasound as well as transbronchial needle aspiration of the subcarinal station 7 mass under the care of Dr. [name]. 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 The patient has a history of severe depression and has been reluctant to consider any form of treatment. He was seen by the palliative care team during his hospitalization at [facility]. Dr. [name] kindly referred the patient to me today for evaluation and discussion of his treatment options. He denied having any significant chest pain but continues to have mild shortness of breath and cough productive of yellowish sputum and occasional hemoptysis. He has lack of appetite and occasional confusion. He also lost a few pounds recently. The patient denied having any history of headache or blurry vision.  His family history significant for father who had brain cancer and her mother is still alive with dementia. He separated and was accompanied today by his ex-wife Dennie Bible. She is currently his caregiver. He used to work in [employer]. He has a history of smoking more than one pack per day for around 42 years and unfortunately he continues to smoke. I strongly encouraged him to quit smoking and provided him with smoke cessation materials. He also has a history of alcohol abuse as well as cocaine abuse.  @SFHPI @  Past Medical History  Diagnosis Date  . Prostate cancer 10/2005    Dr. Achilles Dunk [phone])  . Impaired glucose tolerance   . HLD (hyperlipidemia)   . HTN (hypertension) 1992  . BPH (benign prostatic hypertrophy)   . History of kidney stones   . Anxiety   . CVA (cerebral infarction) 07/2009  .  Depression   . Shortness of breath     at rest, heavy smoker    Past Surgical History  Procedure Laterality Date  . Ureterscopic stone removal  09/2002  . Lung biopsy  01/2004    neg cancer  . Rectal examination  under anesthesia w/ cystoscopy  09/2005    BPH only  . Hifu  12/2005    prostate cancer  . Lih  09/2008    Dr. Achilles Dunk  . Video bronchoscopy with endobronchial ultrasound N/A 10/03/2012    Procedure: VIDEO BRONCHOSCOPY WITH ENDOBRONCHIAL ULTRASOUND;  Surgeon: Kalman Shan, MD;  Location: [facility] OR;  Service: Cardiopulmonary;  Laterality: N/A;    Family History  Problem Relation Age of Onset  . Coronary artery disease Father   . Brain cancer Father     tumor  . Hypertension Mother   . Diabetes Mother     early  . Dementia Mother     Social History History  Substance Use Topics  . Smoking status: Current Every Day Smoker -- 1.00 packs/day for 42 years    Types: Cigarettes  . Smokeless tobacco: Not on file  . Alcohol Use: Yes    Allergies  Allergen Reactions  . Erythromycin Ethylsuccinate     REACTION: unspecified  . Sildenafil     REACTION: just didn't tolerate    Current Outpatient Prescriptions  Medication Sig Dispense Refill  . albuterol-ipratropium (COMBIVENT) 18-103 MCG/ACT inhaler Inhale 1 puff into the lungs 4 (four) times daily.      . ARIPiprazole (ABILIFY) 10 MG tablet Take 10 mg by mouth daily.      Marland Kitchen aspirin EC 81 MG EC tablet Take 1 tablet (81 mg total) by mouth daily.      Marland Kitchen atorvastatin (LIPITOR) 40 MG tablet Take 1 tablet (40 mg total) by mouth daily at 6 PM.  30 tablet  3  . metoprolol tartrate (LOPRESSOR) 25 MG tablet Take 1 tablet (25 mg total) by mouth 2 (two) times daily.  60 tablet  3  . mirtazapine (REMERON) 45 MG tablet Take 45 mg by mouth at bedtime.      . predniSONE (DELTASONE) 20 MG tablet 2 tabs daily for 4 days, then 1 tab daily for 4 days, then 1/2 tab daily for 4 days then stop  14 tablet  0  . traZODone (DESYREL) 50 MG tablet Take 50 mg by mouth at bedtime.       No current facility-administered medications for this visit.    Review of Systems  A comprehensive review of systems was negative except for: Constitutional: positive for  anorexia, fatigue and weight loss Respiratory: positive for cough, dyspnea on exertion and hemoptysis Neurological: positive for weakness Behavioral/Psych: positive for depression  Physical Exam  ZOX:WRUEA, healthy, no distress, well nourished and well developed SKIN: skin color, texture, turgor are normal HEAD: Normocephalic EYES: normal, PERRLA EARS: External ears normal OROPHARYNX:no exudate and no erythema  NECK: supple, no adenopathy LYMPH:  no palpable lymphadenopathy, no hepatosplenomegaly LUNGS: expiratory wheezes bilaterally HEART: regular rate & rhythm and no murmurs ABDOMEN:abdomen soft and non-tender BACK: Back symmetric, no curvature. EXTREMITIES:no edema, no skin discoloration  NEURO: alert & oriented x 3 with fluent speech, no focal motor/sensory deficits  PERFORMANCE STATUS: ECOG 1  LABORATORY DATA: Lab Results  Component Value Date   WBC 7.5 10/07/2012   HGB 11.5* 10/07/2012   HCT 36.2* 10/07/2012   MCV 86.2 10/07/2012   PLT 309 10/07/2012      Chemistry  Component Value Date/Time   NA 138 10/07/2012 0530   K 3.9 10/07/2012 0530   CL 101 10/07/2012 0530   CO2 26 10/07/2012 0530   BUN 12 10/07/2012 0530   CREATININE 0.67 10/07/2012 0530      Component Value Date/Time   CALCIUM 8.9 10/07/2012 0530   ALKPHOS 79 10/07/2012 0530   AST 21 10/07/2012 0530   ALT 13 10/07/2012 0530   BILITOT 0.3 10/07/2012 0530       RADIOGRAPHIC STUDIES: Dg Chest 2 View  10/03/2012  *RADIOLOGY REPORT*  Clinical Data: Preop bronchoscopy/EBUS  CHEST - 2 VIEW  Comparison: PET CT dated 09/17/2012  Findings: Opacity in the right perihilar/infrahilar region, related to known mass and postobstructive opacity.  Left lung essentially clear. No pleural effusion or pneumothorax.  The heart is normal in size.  Mild degenerative changes of the visualized thoracolumbar spine.  IMPRESSION: Opacity in the right perihilar/infrahilar region, related to known mass and postobstructive opacity.   Original Report  Authenticated By: Charline Bills, M.D.    Nm Pet Image Initial (pi) Skull Base To Thigh  09/17/2012  *RADIOLOGY REPORT*  Clinical Data: Subsequent treatment strategy for lung mass.  NUCLEAR MEDICINE PET SKULL BASE TO THIGH  Fasting Blood Glucose:  110  Technique:  16.6 mCi F-18 FDG was injected intravenously. CT data was obtained and used for attenuation correction and anatomic localization only.  (This was not acquired as a diagnostic CT examination.) Additional exam technical data entered on technologist worksheet.  Comparison:  02/23/2004.  Findings:  Neck: No hypermetabolic lymph nodes in the neck.  CT images show no acute findings.  Chest:  There are hypermetabolic lymph nodes in the mediastinum and right hilum.  Lymph nodes extend superiorly to the prevascular space (image 81).  Right hilar/subcarinal nodal mass is somewhat difficult to accurately measure without IV contrast but measures approximately 3.6 x 5.5 cm and has an S U V max of 26.2.  It obstructs the bronchus intermedius and extends across the carina to the proximal left mainstem bronchus.  There is postobstructive collapse/consolidation and nodularity in the right middle and right lower lobes.  Focal hypermetabolism associated with a nodular density in the superior segment right lower lobe which measures approximately 11 mm (CT image 93), with an S U V max of 4.2.  No additional areas of abnormal hypermetabolism are seen in the chest.  CT images show no pericardial or pleural effusion.  Coronary artery calcification.  Abdomen/Pelvis:  No abnormal hypermetabolic activity within the liver, pancreas, adrenal glands or spleen.  No hypermetabolic lymph nodes.  CT images show no acute findings. Punctate stone is seen in the right kidney.  No free fluid.  Skeleton:  No focal hypermetabolic activity to suggest skeletal metastasis.  IMPRESSION:  1.  Right hilar/subcarinal mass obstructs the bronchus intermedius and extends across the midline into the  left mainstem bronchus. Associated postobstructive consolidation and nodularity in the right middle lobe and right lower lobe.  Hypermetabolic mediastinal adenopathy extends to the prevascular space.  Findings are most consistent with primary bronchogenic carcinoma.  2.  Focal hypermetabolism within a nodular density in the right lower lobe may be post obstructive in etiology.  Difficult to exclude a primary lesion if  histology is consistent with small cell carcinoma. 3.  No evidence of distant metastatic disease. 4.  Punctate right renal stone.   Original Report Authenticated By: Leanna Battles, M.D.    Dg Chest Port 1 View  10/03/2012  *RADIOLOGY REPORT*  Clinical Data: Evaluate endotracheal tube placement.  PORTABLE CHEST - 1 VIEW  Comparison: Chest x-ray 10/02/2012.  Findings: An endotracheal tube is in place with tip 5.9 cm above the carina.  Persistent and increasing opacity in the medial aspect of the right hemithorax likely to reflect worsening postobstructive atelectasis and/or consolidation, in the setting of a known perihilar mass.  Lungs otherwise appear clear.  No definite pleural effusions.  Probable skin fold artifact in the right apex.  No evidence of pulmonary edema.  Heart size is within normal limits. Mediastinal contours are otherwise unremarkable.  IMPRESSION: 1.  Tip of endotracheal tube is 5.9 cm above the carina. 2.  Increasing opacity in the right middle lobe likely reflects slight worsening of postobstructive changes related to known right perihilar mass.   Original Report Authenticated By: Trudie Reed, M.D.     ASSESSMENT: this is a very pleasant 57 years old white male recently diagnosed with a stage IIIa non-small cell lung cancer, squamous cell carcinoma with increasing shortness of breath and hemoptysis and questionable tumor extension to the left mainstem bronchus.   PLAN: I have a lengthy discussion with the patient and his ex-wife today about his current disease stage,  prognosis and treatment options. I strongly encouraged the patient to consider treatment and offered him a course of concurrent chemoradiation with weekly carboplatin for AUC of 2 and paclitaxel 45 mg/M2 every week with radiation. I discussed with the patient adverse effect of this treatment including but not limited to alopecia, suppression, nausea or vomiting, peripheral neuropathy, liver or renal dysfunction. The patient has been reluctant about considering treatment. He would like to have more time to think about this option. He lives in [place] [facility] [place] [facility] [place] and he may consider treatment at [facility] if he decided to proceed with the treatment. The patient was advised to call us immediately if he decided to proceed with treatment and I will be happy to arrange for him an appointment at [facility] but I will also be happy to treat him here in [place] if you decide to do it. I gave the patient and his ex-wife the time to ask questions and I answered them completely to their satisfaction.  All questions were answered. The patient knows to call the clinic with any problems, questions or concerns. We can certainly see the patient much sooner if necessary.  Thank you so much for allowing me to participate in the care of Tonia Brooms. I will continue to follow up the patient with you and assist in his care.  I spent 30 minutes counseling the patient face to face. The total time spent in the appointment was 55 minutes.  Aydin Hink K. 10/13/2012, 12:03 PM

## 2012-10-13 NOTE — Patient Instructions (Signed)
Your recently diagnosed with locally advanced non-small cell lung cancer, stage IIIA, squamous cell carcinoma. I recommended for you a course of concurrent chemoradiation with carboplatin and paclitaxel.

## 2012-10-15 ENCOUNTER — Encounter: Payer: Self-pay | Admitting: *Deleted

## 2012-10-15 ENCOUNTER — Telehealth: Payer: Self-pay | Admitting: Internal Medicine

## 2012-10-15 NOTE — Telephone Encounter (Signed)
I called him to review his status He stated that he has decided to forgo any cancer Rx States his mood is okay---very hard to tell  i urged him to set up appt her within a month Discussed hospice but he replied "I ain't ready for that yet"

## 2012-10-15 NOTE — Progress Notes (Signed)
Wife called regarding financial questions.  I gave her Darlena Clark's phone number for assistance.

## 2012-10-16 ENCOUNTER — Telehealth: Payer: Self-pay | Admitting: *Deleted

## 2012-10-16 NOTE — Telephone Encounter (Signed)
Pt wife called regarding financial questions.  Phone number for [facility] given.

## 2012-10-18 ENCOUNTER — Telehealth: Payer: Self-pay | Admitting: *Deleted

## 2012-10-18 NOTE — Telephone Encounter (Signed)
Let her know that I did call him after his biopsy He stated he decided not to have Rx He is hospice appropriate but best that we defer any discussion till the appt in person  Can offer tomorrow afternoon if she thinks it should be sooner

## 2012-10-18 NOTE — Telephone Encounter (Signed)
Wife calling with questions about how to handle patient, per wife pt have falling back into a deep depression, patient is not taking his medications, not getting out of bed, pt has home health 3 days and they are also having a hard time with him, wife wanted you to talk with patient about whether or not he will do radiation for 5 days a week for 6 weeks and chemo 1 day per week? Is he hospice appropriate ? Wife states pt doesn't have the will to live, she scheduled appt on Monday at 3:45pm, she has more questions and wanted to discuss at the office visit.

## 2012-10-19 ENCOUNTER — Encounter: Payer: Self-pay | Admitting: *Deleted

## 2012-10-19 NOTE — Telephone Encounter (Signed)
Left detailed message on VM advised her to call if any questions

## 2012-10-19 NOTE — Progress Notes (Signed)
[facility] Clinical Social Work  Clinical Social Work received call from patient's spouse, Klayton Monie, patient continues to have difficulty at home.  She states patient is not taking medications unless someone is present and reminds him.  At this time, patient has indicated to her that he does not want chemotherapy and radiation, but is still somewhat unsure.  Mrs. Ala called requesting "next steps"... CSW discussed contacting his primary care physician or his pulmonologist regarding palliative care options if does not want aggressive cancer treatment. CSW encouraged patient's spouse to contact with any additional questions or concerns.  Kathrin Penner, MSW, LCSW Clinical Social Worker [facility] [phone]

## 2012-10-22 ENCOUNTER — Encounter: Payer: Self-pay | Admitting: Internal Medicine

## 2012-10-22 ENCOUNTER — Ambulatory Visit (INDEPENDENT_AMBULATORY_CARE_PROVIDER_SITE_OTHER): Payer: Self-pay | Admitting: Internal Medicine

## 2012-10-22 VITALS — BP 110/60 | HR 108 | Temp 98.4°F | Wt 129.0 lb

## 2012-10-22 DIAGNOSIS — I2510 Atherosclerotic heart disease of native coronary artery without angina pectoris: Secondary | ICD-10-CM

## 2012-10-22 DIAGNOSIS — F329 Major depressive disorder, single episode, unspecified: Secondary | ICD-10-CM

## 2012-10-22 DIAGNOSIS — F3289 Other specified depressive episodes: Secondary | ICD-10-CM

## 2012-10-22 NOTE — Progress Notes (Signed)
Subjective:    Patient ID: [name] [name], male    DOB: 1956/06/05, 57 y.o.   MRN: [id number]  HPI Here with ex-wife  Still hasn't decided about whether to proceed with chemo and RT for his cancer  Has home health care 3 days per week for 4 hours Not really getting out of bed No longer taking the meds for depression--only took for a week  Breathing is "okay" but ex wife doesn't agree Chronic cough Gets SOB with minimal exertion  Clearly depressed still Not taking any of his meds now Worries about his appetite  Current Outpatient Prescriptions on File Prior to Visit  Medication Sig Dispense Refill  . albuterol-ipratropium (COMBIVENT) 18-103 MCG/ACT inhaler Inhale 1 puff into the lungs 4 (four) times daily.      . ARIPiprazole (ABILIFY) 10 MG tablet Take 10 mg by mouth daily.      Marland Kitchen aspirin EC 81 MG EC tablet Take 1 tablet (81 mg total) by mouth daily.      Marland Kitchen atorvastatin (LIPITOR) 40 MG tablet Take 1 tablet (40 mg total) by mouth daily at 6 PM.  30 tablet  3  . metoprolol tartrate (LOPRESSOR) 25 MG tablet Take 1 tablet (25 mg total) by mouth 2 (two) times daily.  60 tablet  3  . mirtazapine (REMERON) 45 MG tablet Take 45 mg by mouth at bedtime.      . traZODone (DESYREL) 50 MG tablet Take 50 mg by mouth at bedtime.       No current facility-administered medications on file prior to visit.    Allergies  Allergen Reactions  . Erythromycin Ethylsuccinate     REACTION: unspecified  . Sildenafil     REACTION: just didn't tolerate    Past Medical History  Diagnosis Date  . Prostate cancer 10/2005    Dr. Achilles Dunk [phone])  . Impaired glucose tolerance   . HLD (hyperlipidemia)   . HTN (hypertension) 1992  . BPH (benign prostatic hypertrophy)   . History of kidney stones   . Anxiety   . CVA (cerebral infarction) 07/2009  . Depression   . Shortness of breath     at rest, heavy smoker    Past Surgical History  Procedure Laterality Date  . Ureterscopic stone removal   09/2002  . Lung biopsy  01/2004    neg cancer  . Rectal examination under anesthesia w/ cystoscopy  09/2005    BPH only  . Hifu  12/2005    prostate cancer  . Lih  09/2008    Dr. Achilles Dunk  . Video bronchoscopy with endobronchial ultrasound N/A 10/03/2012    Procedure: VIDEO BRONCHOSCOPY WITH ENDOBRONCHIAL ULTRASOUND;  Surgeon: Kalman Shan, MD;  Location: [facility] OR;  Service: Cardiopulmonary;  Laterality: N/A;    Family History  Problem Relation Age of Onset  . Coronary artery disease Father   . Brain cancer Father     tumor  . Hypertension Mother   . Diabetes Mother     early  . Dementia Mother     History   Social History  . Marital Status: Married    Spouse Name: N/A    Number of Children: 2  . Years of Education: N/A   Occupational History  . [employer] Self Employed   Social History Main Topics  . Smoking status: Current Every Day Smoker -- 1.00 packs/day for 42 years    Types: Cigarettes  . Smokeless tobacco: Never Used  . Alcohol Use: Yes  .  Drug Use: No  . Sexually Active: No   Other Topics Concern  . Not on file   Social History Narrative  . No narrative on file   Review of Systems Still not eating well Weight is up a few pounds Frequent cough    Objective:   Physical Exam  Constitutional: No distress.  disheveled  Pulmonary/Chest: Effort normal. No respiratory distress. He has wheezes. He has no rales.  Coarse cough Wheezes in expiration Decreased breath sounds  Neurological:  Seems to have confused speech at times---not thinking clearly  Psychiatric:  Distant Will interact when I am very direct and go right up to him and press Clearly depressed          Assessment & Plan:

## 2012-10-22 NOTE — Assessment & Plan Note (Signed)
Severe  Won't take meds Negotiated---he seems to care about his appetite. Discussed the mirtazapine and asked him to restart this  He does not have faith Not clearly facing the mortality his lung cancer poses

## 2012-10-22 NOTE — Assessment & Plan Note (Signed)
Recent MI Not taking beta blocker, aspirin or statin Will try to get him to take his antidepressant and improve his attitude Still smoking

## 2012-10-22 NOTE — Assessment & Plan Note (Signed)
Doesn't seem to understand completely He doesn't clear state he wants treatment and he even has trouble getting out of bed

## 2012-10-26 ENCOUNTER — Telehealth: Payer: Self-pay

## 2012-10-26 MED ORDER — BENZONATATE 200 MG PO CAPS: 200.0000 mg | ORAL_CAPSULE | Freq: Three times a day (TID) | ORAL | Status: AC | PRN

## 2012-10-26 NOTE — Telephone Encounter (Signed)
Okay to send Rx for benzonatate 200mg  tid prn #60 x 2 Can try narcotic if this isn't effective but I am afraid of sedating him more

## 2012-10-26 NOTE — Telephone Encounter (Signed)
rx sent to pharmacy by e-script Spoke with patient's wife and advised results, wife was asking for a cough syrup because pt may not take the tablets as prescribed, advised wife about sedation, per wife she will call back on Monday if no better.

## 2012-10-26 NOTE — Telephone Encounter (Signed)
pts wife said home health worker called her and said pt was coughing continuously today; pt is weak and not eating a lot.Today cannot eat due to coughing so much. HH worker has shown pt how to use inhaler and pt is a little better with breathing but pt still coughing a lot. (similar when Dr Alphonsus Sias saw pt on Mon). [facility] [address] Mrs Kitner request call back.

## 2012-10-29 ENCOUNTER — Telehealth: Payer: Self-pay

## 2012-10-29 NOTE — Telephone Encounter (Signed)
Spoke with [facility] @ [facility] and advised results, she will try and get the wife to help pt make a decision on care.

## 2012-10-29 NOTE — Telephone Encounter (Signed)
I am aware of all that except the gun in bedside table We have discussed hospice but he still didn't decide if he wants to try treatment and is supposed to be coming in soon to discuss this again

## 2012-10-29 NOTE — Telephone Encounter (Signed)
Gina with [facility] said care giver found pt in bed this AM but pt said he fell last night and laid in floor all night until finally managed to get back in bed. RN saw pt this morning pt progressively getting weaker, not eating as well, seems depressed. Almira Coaster concerned that there is a gun in pts bedside table. Almira Coaster said pt has hx of hospitalization for major depression. [facility] care giver in home 5 days a week starting in April from 1-4 pm. Almira Coaster does not feel pt is to the point of harming himself but still concerned with pt increase in depression a possiblity. Almira Coaster wants Dr Alphonsus Sias to be aware of pts state of mind and Gina request hospice to go out for assessment. Recently found cancerous mass in lungs.Please advise. Wilburn Mylar if condition changed or worsened to call office back.

## 2012-10-30 ENCOUNTER — Telehealth: Payer: Self-pay | Admitting: *Deleted

## 2012-10-30 NOTE — Telephone Encounter (Signed)
Tried to call pt and wife regarding treatment plans. No answer

## 2012-11-02 ENCOUNTER — Telehealth: Payer: Self-pay

## 2012-11-02 NOTE — Telephone Encounter (Signed)
Pat left v/m that Home health has called her several times this week and is concerned; pt not eating, HH recommends hospice or admission to hospital. Pt has fallen x 2 this week and has pain in hip. Dennie Bible wants to know if Dr Alphonsus Sias would do home visit. Pt has appt 11/12/12 but HH does not think pt can wait that long and suggest something done immediately per [facility].Please advise.

## 2012-11-02 NOTE — Telephone Encounter (Signed)
I can probably do a home visit around 11:45 next Thursday Find out if he is okay with this and if his wife can join me If he doesn't make a decision about treatment then, I will probably consult hospice

## 2012-11-02 NOTE — Telephone Encounter (Signed)
.  left message to have patient's ex-wife return my call.

## 2012-11-02 NOTE — Telephone Encounter (Signed)
Verlon Setting left v/m [facility] will stay with pt this weekend and is not necessary for Dr Alphonsus Sias to see pt today. Please advise.

## 2012-11-05 ENCOUNTER — Telehealth: Payer: Self-pay

## 2012-11-05 MED ORDER — MIRTAZAPINE 45 MG PO TABS: 45.0000 mg | ORAL_TABLET | Freq: Every day | ORAL | Status: AC

## 2012-11-05 NOTE — Telephone Encounter (Signed)
See phone note 11/05/12

## 2012-11-05 NOTE — Telephone Encounter (Signed)
Discussed with wife Not sure what else to do to get him to eat  I will make a home visit on Thursday ~11:45AM

## 2012-11-05 NOTE — Telephone Encounter (Signed)
Please check with him and wife about whether they want a home visit on Thursday

## 2012-11-05 NOTE — Telephone Encounter (Signed)
Pat left v/m that pt is still not eating; drinking small amt liquids and wants suggestion of what to do. Also pt has been getting Remeron from Dr Alphonse Guild not seeing that doctor now and request Dr Alphonsus Sias to refill Remeron to [facility] [address]Please advise.

## 2012-11-06 ENCOUNTER — Encounter: Payer: Self-pay | Admitting: Cardiology

## 2012-11-07 ENCOUNTER — Telehealth: Payer: Self-pay

## 2012-11-07 ENCOUNTER — Inpatient Hospital Stay: Payer: Self-pay | Admitting: Internal Medicine

## 2012-11-07 NOTE — Telephone Encounter (Signed)
Okay to send Rx for silvadene (generic)  60gm x 1 Apply daily for open areas till healed

## 2012-11-07 NOTE — Telephone Encounter (Signed)
[name] [name], pts wife left v/m that she and Chip will be at home visit at 11:45 am tomorrow. [name] [name] also request Sulfadiazine for bed sores called to [facility] on [address]Please advise.

## 2012-11-08 ENCOUNTER — Ambulatory Visit (INDEPENDENT_AMBULATORY_CARE_PROVIDER_SITE_OTHER): Payer: Self-pay | Admitting: Internal Medicine

## 2012-11-08 ENCOUNTER — Encounter: Payer: Self-pay | Admitting: Internal Medicine

## 2012-11-08 VITALS — BP 102/60 | HR 84 | Resp 20

## 2012-11-08 DIAGNOSIS — C3491 Malignant neoplasm of unspecified part of right bronchus or lung: Secondary | ICD-10-CM

## 2012-11-08 DIAGNOSIS — J449 Chronic obstructive pulmonary disease, unspecified: Secondary | ICD-10-CM

## 2012-11-08 DIAGNOSIS — C61 Malignant neoplasm of prostate: Secondary | ICD-10-CM

## 2012-11-08 DIAGNOSIS — F329 Major depressive disorder, single episode, unspecified: Secondary | ICD-10-CM

## 2012-11-08 DIAGNOSIS — C3490 Malignant neoplasm of unspecified part of unspecified bronchus or lung: Secondary | ICD-10-CM

## 2012-11-08 DIAGNOSIS — I10 Essential (primary) hypertension: Secondary | ICD-10-CM

## 2012-11-08 MED ORDER — SILVER SULFADIAZINE 1 % EX CREA: 1.0000 "application " | TOPICAL_CREAM | Freq: Every day | CUTANEOUS | Status: AC

## 2012-11-08 NOTE — Assessment & Plan Note (Signed)
Low now No Rx  BP Readings from Last 3 Encounters:  11/08/12 102/60  10/22/12 110/60  10/11/12 99/63

## 2012-11-08 NOTE — Assessment & Plan Note (Signed)
May have been recurring but certainly not an issue now

## 2012-11-08 NOTE — Progress Notes (Signed)
Subjective:    Patient ID: [name] [name], male    DOB: [birth year], 57 y.o.   MRN: [id number]  HPI Ex-wife and caregiver from [facility]  He has not even been getting out of bed Went to chair yesterday for aides to fix bed Will sit up on side of bed to use urinal Incontinent of stool in bed  Still drinking some but not much Barely eating They are trying ensure with ice cream---he will take this some   Breathing is "rough" Some cough---mostly dry (but occasionally thick phlegm) Tessalon has really helped---had been nonstop till then No chest pain  Current Outpatient Prescriptions on File Prior to Visit  Medication Sig Dispense Refill  . albuterol-ipratropium (COMBIVENT) 18-103 MCG/ACT inhaler Inhale 1 puff into the lungs 4 (four) times daily.      . benzonatate (TESSALON) 200 MG capsule Take 1 capsule (200 mg total) by mouth 3 (three) times daily as needed for cough.  60 capsule  2  . mirtazapine (REMERON) 45 MG tablet Take 1 tablet (45 mg total) by mouth at bedtime.  30 tablet  5  . silver sulfADIAZINE (SILVADENE) 1 % cream Apply 1 application topically daily. To open wounds until healed  50 g  1   No current facility-administered medications on file prior to visit.    Allergies  Allergen Reactions  . Erythromycin Ethylsuccinate     REACTION: unspecified  . Sildenafil     REACTION: just didn't tolerate    Past Medical History  Diagnosis Date  . Prostate cancer 10/2005    Dr. Achilles Dunk [phone])  . Impaired glucose tolerance   . HLD (hyperlipidemia)   . HTN (hypertension) 1992  . BPH (benign prostatic hypertrophy)   . History of kidney stones   . Anxiety   . CVA (cerebral infarction) 07/2009  . Depression   . Shortness of breath     at rest, heavy smoker    Past Surgical History  Procedure Laterality Date  . Ureterscopic stone removal  09/2002  . Lung biopsy  01/2004    neg cancer  . Rectal examination under anesthesia w/ cystoscopy  09/2005    BPH only  .  Hifu  12/2005    prostate cancer  . Lih  09/2008    Dr. Achilles Dunk  . Video bronchoscopy with endobronchial ultrasound N/A 10/03/2012    Procedure: VIDEO BRONCHOSCOPY WITH ENDOBRONCHIAL ULTRASOUND;  Surgeon: Kalman Shan, MD;  Location: [facility] OR;  Service: Cardiopulmonary;  Laterality: N/A;    Family History  Problem Relation Age of Onset  . Coronary artery disease Father   . Brain cancer Father     tumor  . Hypertension Mother   . Diabetes Mother     early  . Dementia Mother     History   Social History  . Marital Status: Married    Spouse Name: N/A    Number of Children: 2  . Years of Education: N/A   Occupational History  . [employer] Self Employed   Social History Main Topics  . Smoking status: Current Every Day Smoker -- 1.00 packs/day for 42 years    Types: Cigarettes  . Smokeless tobacco: Never Used  . Alcohol Use: Yes  . Drug Use: No  . Sexually Active: No   Other Topics Concern  . Not on file   Social History Narrative  . No narrative on file   Review of Systems Clearly has been losing weight Aides trying to do range of  motion with him in bed Not doing anything---no TV, reading, etc    Objective:   Physical Exam  Constitutional: No distress.  In bed, wasting of muscles  Neck: No thyromegaly present.  Cardiovascular: Normal rate, regular rhythm and normal heart sounds.  Exam reveals no gallop.   No murmur heard. Pulmonary/Chest:  Coarse bronchial sounds on left, slightly decreased breath sounds on right Loud rhonchi expiratory throughout  Abdominal: Soft. There is no tenderness.  Lymphadenopathy:    He has no cervical adenopathy.  Psychiatric:  Flat and limited interaction Very limited speech Closes eyes quickly if not engaged          Assessment & Plan:

## 2012-11-08 NOTE — Assessment & Plan Note (Signed)
Severe and resistant to Rx Will continue mirtazapine in hopes it will help his limited appetite

## 2012-11-08 NOTE — Telephone Encounter (Signed)
rx sent to pharmacy by e-script Spoke with patient's wife and advised results  

## 2012-11-08 NOTE — Assessment & Plan Note (Signed)
Breathing may be some better with the inhaler Tessalon has helped cough Will probably benefit from having oxygen Will give Rx for roxanol to have

## 2012-11-08 NOTE — Assessment & Plan Note (Signed)
Has really declined Clearly doesn't have the will to have any treatment since he won't even get out of bed This is due to severe depression preceding the cancer diagnosis---but has been resistant to treatment (even when inpatient care was tried) Will consult hospice DNR done--hard to tell if he understands but clearly doesn't want aggressive therapy

## 2012-11-09 ENCOUNTER — Telehealth: Payer: Self-pay | Admitting: Family Medicine

## 2012-11-09 NOTE — Telephone Encounter (Signed)
That is a surprise but glad she has decided to go ahead with the visit

## 2012-11-09 NOTE — Telephone Encounter (Signed)
Amil Amen from Hospice called.  They attempted visit on Thursday but pt's ex-wife refused.  She has since changed her mind and they are going to see him on Saturday.

## 2012-11-12 ENCOUNTER — Ambulatory Visit: Payer: Self-pay | Admitting: Internal Medicine

## 2012-11-29 ENCOUNTER — Telehealth: Payer: Self-pay | Admitting: *Deleted

## 2012-12-30 NOTE — Telephone Encounter (Signed)
Mrs. Menees wanted to call and thank Dr Alphonsus Sias for his condolence call and to thank Dr Alphonsus Sias for all he did for Mr Valent. Mrs Schear wanted to let Dr Alphonsus Sias know also that she and Mr Boggus are not divorced and are not legally separated.

## 2012-12-30 NOTE — Telephone Encounter (Signed)
Call from hospice stating that pt died this morning @ 7:40am

## 2012-12-30 NOTE — Telephone Encounter (Signed)
I didn't realize that Good to know

## 2012-12-30 NOTE — Telephone Encounter (Signed)
Noted Condolence call made to ex-wife

## 2012-12-30 DEATH — deceased

## 2013-02-04 IMAGING — CT CT HEAD WITHOUT CONTRAST
1 series · 15 of 30 positions shown, 19 images · non-contrast
Comparison: none

REASON FOR EXAM: weakness
COMMENTS:

[Series 2: soft tissue · axial · 0.42mm/px · z∈[-122,+28]mm · 15 of 34 slices shown, 19 images]
[im 2/34  brain]
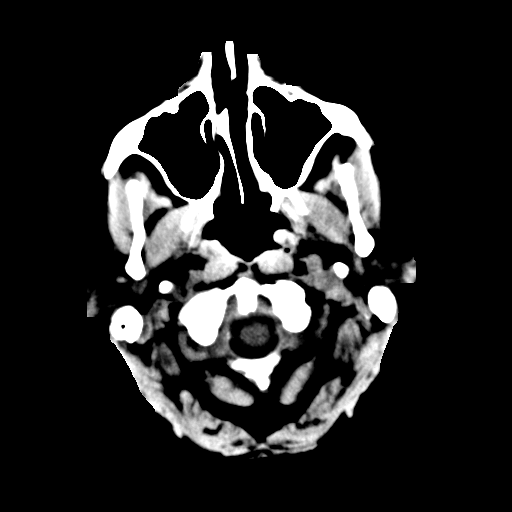
[im 2/34  bone]
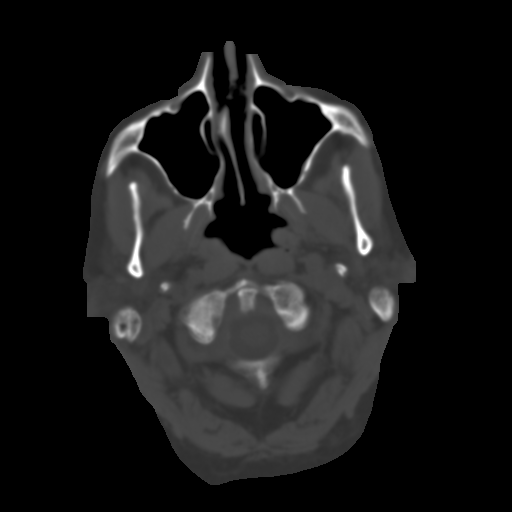
[im 4/34  brain]
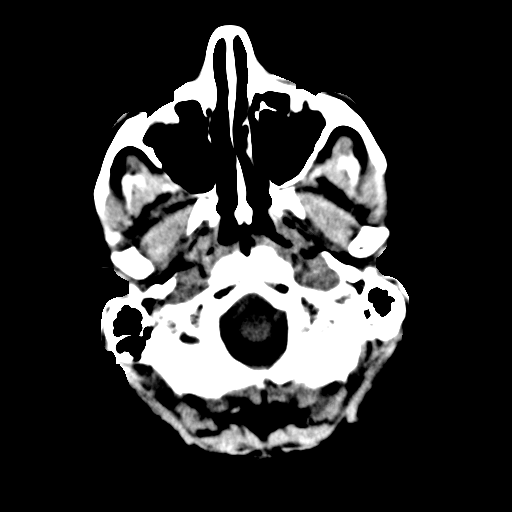
[im 6/34  brain]
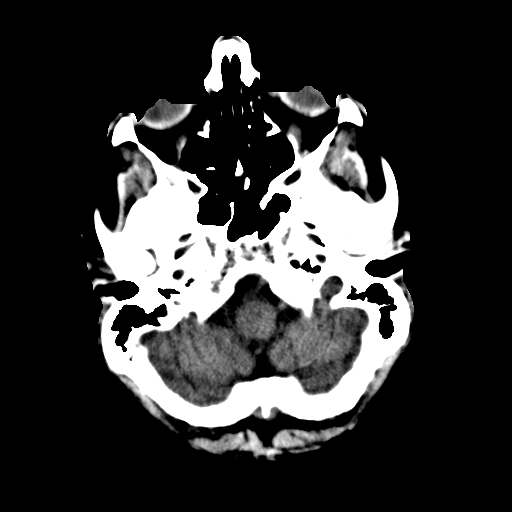
[im 8/34  brain]
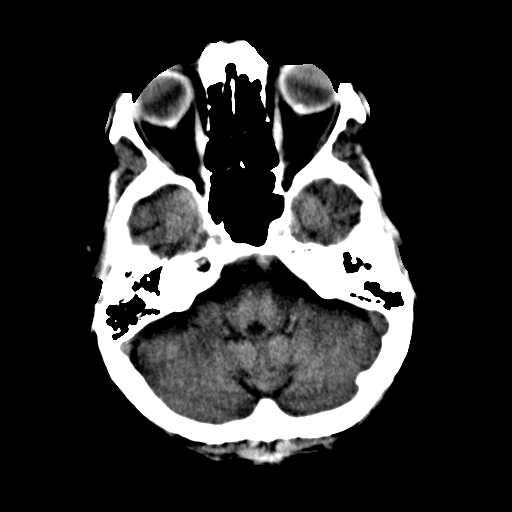
[im 11/34  brain]
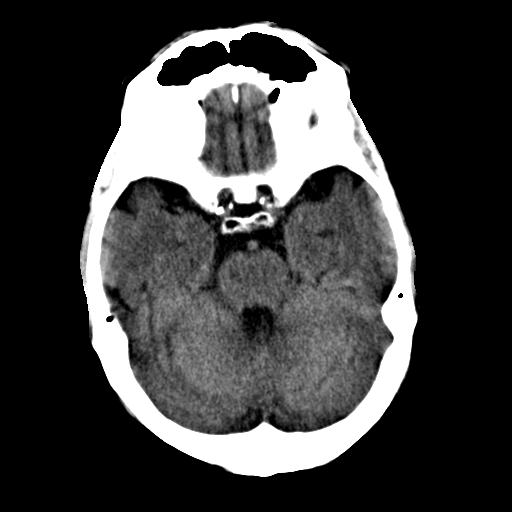
[im 11/34  bone]
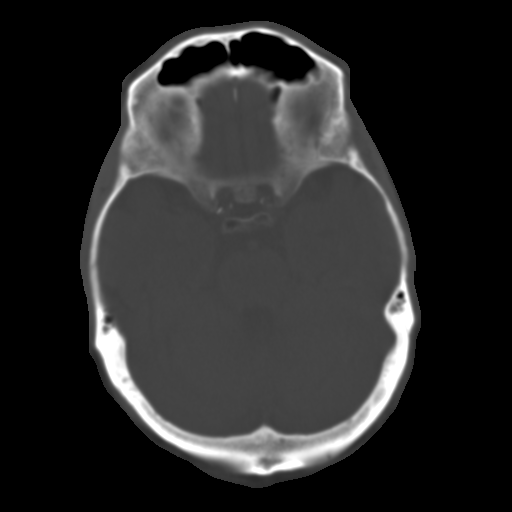
[im 13/34  brain]
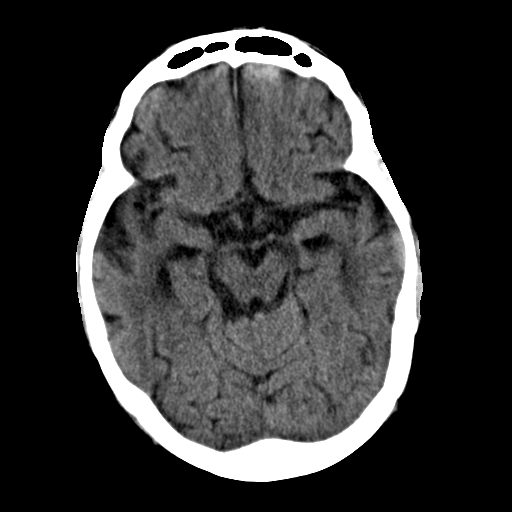
[im 15/34  brain]
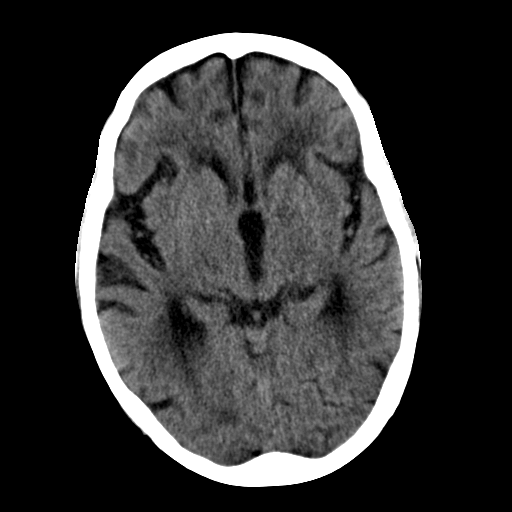
[im 18/34  brain]
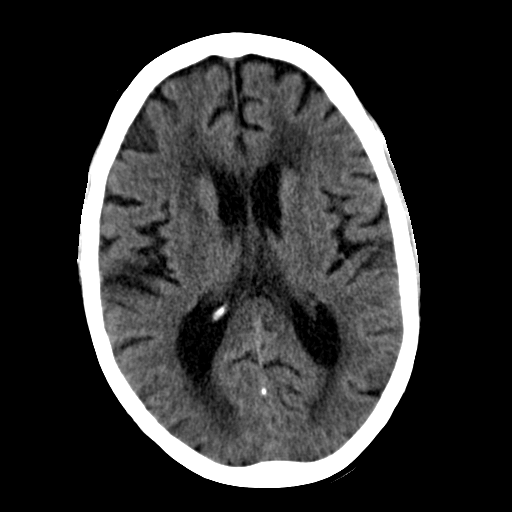
[im 19/34  brain]
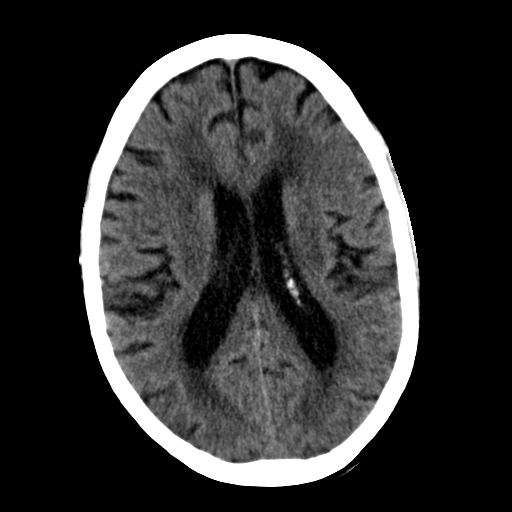
[im 19/34  bone]
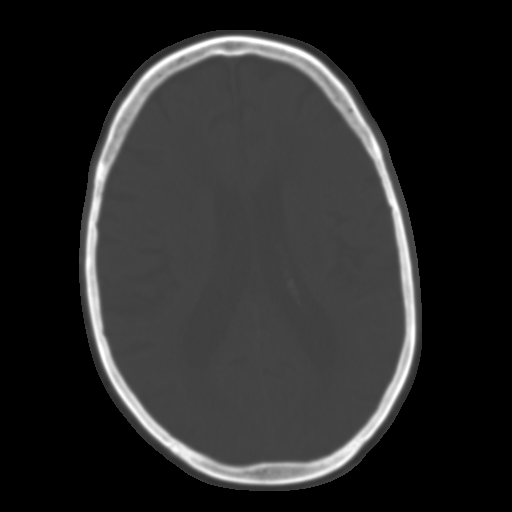
[im 21/34  brain]
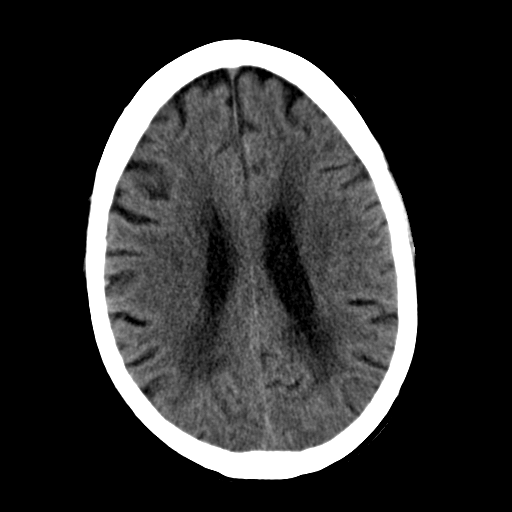
[im 23/34  brain]
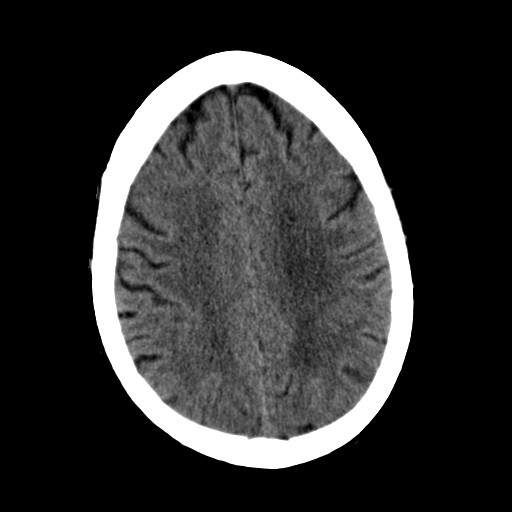
[im 26/34  brain]
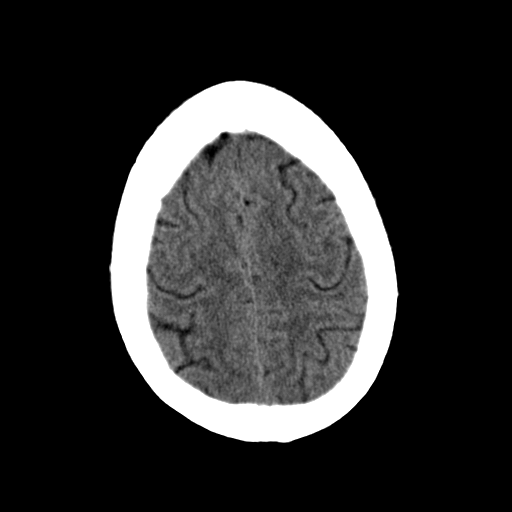
[im 28/34  brain]
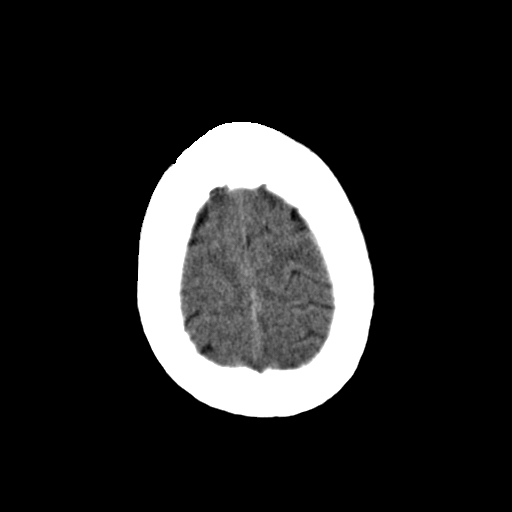
[im 28/34  bone]
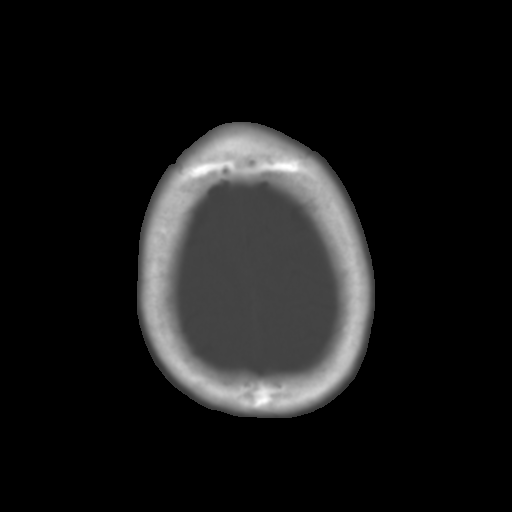
[im 30/34  brain]
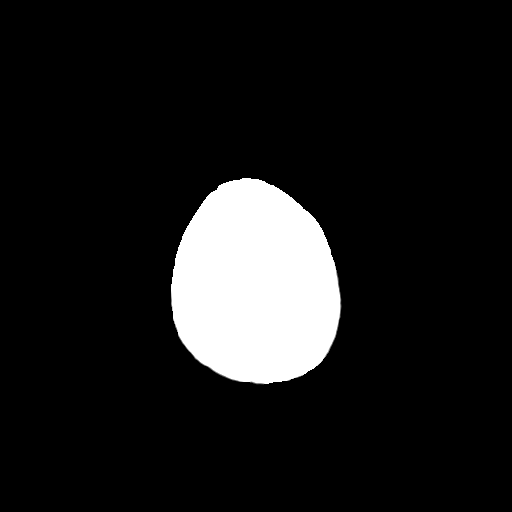
[im 32/34  brain]
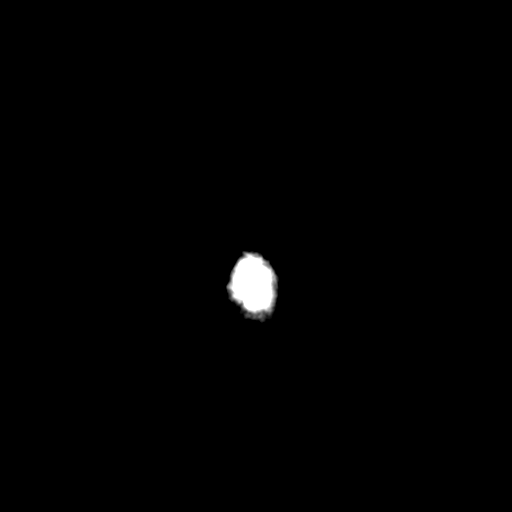

[15 of 30 positions shown; findings below may reference images not displayed]

PROCEDURE:     CT  - CT HEAD WITHOUT CONTRAST  - August 08, 2012 [DATE]

RESULT:     Axial noncontrast CT scanning was performed through the brain
with reconstructions at 5 mm intervals and slice thicknesses.

There is mild diffuse cerebral and cerebellar atrophy with compensatory
ventriculomegaly. There is decreased density in the deep white matter of
both cerebral hemispheres consistent with chronic small vessel ischemic type
change. There is decreased density in the anterior limb of the basal ganglia
bilaterally and there are old lacunar infarctions in the thalamic nuclei
bilaterally. The cerebellum and brainstem are normal in density. There is no
evidence of an intracranial hemorrhage nor of an acute ischemic event.

At bone window settings the observed portions of the paranasal sinuses and
mastoid air cells are clear. There is no evidence of an acute skull fracture.
IMPRESSION: 1. There is no evidence of an acute ischemic or hemorrhagic infarction.
2. There is fairly extensive white matter density change as well as the
presence of bilateral thalamic lacunar infarctions consistent with chronic
small vessel ischemia.
3. There is mild diffuse cerebral and cerebellar atrophy with compensatory
ventriculomegaly out of proportion to the patient's age. Is there history of
long-standing hypertension?

[REDACTED]

## 2014-11-21 NOTE — Consult Note (Signed)
PATIENT NAME:  [name] [name], [name] [name] MR#:  [id number] DATE OF BIRTH:  [birth year]  DATE OF CONSULTATION:  08/10/2012  REFERRING PHYSICIAN:  Orson Slick, MD CONSULTING PHYSICIAN:  Vivien Presto, MD  REASON FOR CONSULTATION: Pneumonia.  HISTORY OF PRESENT ILLNESS: The patient is a 59 year old Caucasian male with history of stroke in the past who was not on any medications and history of depression who has been admitted to psychiatry behavioral health unit for a couple of days. The patient has been having decreased p.o. intake, severe depression, weakness, not eating enough or moving much and is under the care of a psychiatrist for his depression and medicine was consulted for the pneumonia. The patient states that he has been having cough since admission to behavioral health. There is no fever or chills. There is no shortness of breath or chest pain. He does have occasional whitish sputum, however. No sick contacts.  He does have tobacco history and wishes to have a cigarette now.   PAST MEDICAL HISTORY: 1. History of stroke.  2. He thinks he might have had high blood pressure at one point, but not on any medications.  3. History of prostate cancer.  FAMILY HISTORY: Denies any family history of diabetes, hypertension or stroke.   ALLERGIES: ERYTHROMYCIN.  MEDICATIONS: He does not take any medications. At one point, he was on Plavix which was stopped. He was on aspirin which he has not taken.   Medications while in behavioral health are Levaquin 750 mg daily, mirtazapine 15 mg at bedtime, p.r.n. milk of magnesia and Tylenol as well as nicotine oral inhaler.   SOCIAL HISTORY: Separated from his wife. Three packs of cigarettes a day. Used to drink several beers a day, but not for several weeks. No drug use.     REVIEW OF SYSTEMS:  CONSTITUTIONAL: No fever. Positive for generalized weakness.   EYES: No blurry vision or double vision.   ENT: No tinnitus or hearing loss.   RESPIRATORY:  Positive for cough with whitish sputum. No shortness of breath or chest pain.   CARDIOVASCULAR: No chest pain, orthopnea or swelling in the legs.  GENITOURINARY: No urinary frequency or incontinence or hematuria.   GASTROINTESTINAL: No nausea or vomiting. Poor p.o. intake. No black tarry stools.   MUSCULOSKELETAL: Has some leg pain.  SKIN: Denies any rashes.   NEUROLOGIC: History of stroke.  PSYCHIATRIC: Positive for depression.   PHYSICAL EXAMINATION:  VITAL SIGNS: Temperature 99.5, pulse rate 86, respiratory rate 20, blood pressure 96/53 and oxygen sats 100% on room air.   GENERAL: The patient is an unkempt, elderly male lying in bed, flat affect, cooperative.   HEENT: Normocephalic, atraumatic. Pupils are equal and reactive. Extraocular muscles intact. Dry mucous membranes. Poor dentition.   NECK: Supple. No thyroid tenderness. No cervical lymphadenopathy.   CARDIOVASCULAR: S1 and S2 regular rate and rhythm. No murmurs, rubs or gallops.   PULMONARY: Good air entry. Some right-sided crackles at the base.  ABDOMEN: Soft, nontender and nondistended. Positive bowel sounds in all quadrants.   EXTREMITIES: No significant lower extremity edema.   SKIN: Some excoriations and abrasions on the lower extremities, more on the right.   NEUROLOGIC: Cranial nerves II through XII grossly intact. Strength is 5 out of 5 in all extremities. Sensation is intact to light touch.   PSYCH: :Cooperative, awake, alert and oriented.   LABS/RADIOLOGIC STUDIES:  Glucose 97, BUN 9, creatinine 0.95, sodium 136 and potassium 3.2. LFTs: Total protein 5.7, albumin 2, bilirubin 1.3, AST 14 and  ALT 9. Troponin negative. TSH 1.93. U-tox negative. WBC 5.6, hemoglobin 9.5 and hematocrit 27. Of note, hemoglobin was 10.8 on the 8th. All these labs are from yesterday, of note. Platelets are 262.  Urinalysis is not suggestive of infection.   CT of the head with contrast from January 8th is showing no evidence of  acute ischemic or hemorrhagic infarction, fairly extensive white matter density changes as well as the presence of bilateral thalamic lacunar infarct consistent with chronic small vessel ischemia and mild diffuse cerebral and cerebellar atrophy with compensatory ventriculomegaly out of proportion to the patient's age.   X-ray of the chest: Right middle lobe pneumonia.   ASSESSMENT AND PLAN: We have a 60 year old Caucasian male with a history of prostate cancer and stroke noncompliant with medications, not taking any medications, who was admitted to behavioral health for severe depression to the point of not eating and decreased mobility who was also noted to have a pneumonia. Medicine was consulted for the pneumonia. At this point, he has no fever or significant leukocytosis. He is not hypoxic. I would continue the Levaquin and treat for a total of 5 days with this dose. I would add Robitussin for the cough. In regards to anemia, the patient does have a drop in hemoglobin on the 9th from the 8th and when he turned back overall the patient is anemic from last couple of years to now. We would order iron studies, ferritin, stool guaiac, reticulocyte count and vitamin B12 in serum. He is not macrocytic at this point, however. We would replete the hypokalemia. He does have severe depression, which he is getting treatment for. He does have tobacco abuse and he was counseled for 3 minutes about cessation. 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 I would doubt he is in withdrawal. He has elevated bilirubin. We would trend this. There is no elevation of AST, ALT or alkaline phosphatase. If it would trend up, would consider right upper quadrant ultrasound. In regards to his prostate cancer, we would check a PSA, but he should follow with his oncologist.   Thank you for the kind consult and we would follow along with you.   TOTAL TIME SPENT: 60 minutes.    ____________________________ Vivien Presto, MD sa:sb D: 08/10/2012 13:03:37 ET T: 08/10/2012 13:37:26 ET JOB#: [id number]  cc: Vivien Presto, MD, <Dictator> Vivien Presto MD ELECTRONICALLY SIGNED 08/20/2012 20:35

## 2014-11-21 NOTE — Consult Note (Signed)
Chief Complaint:   Subjective/Chief Complaint still same, wants to go home, sitting outside room in chair   VITAL SIGNS/ANCILLARY NOTES: **Vital Signs.:   27-Jan-14 07:27   Vital Signs Type Routine   Temperature Temperature (F) 98.1   Celsius 36.7   Pulse Pulse 80   Respirations Respirations 20   Systolic BP Systolic BP 177   Diastolic BP (mmHg) Diastolic BP (mmHg) 62   Brief Assessment:   Cardiac Regular  no murmur    Respiratory normal resp effort    Gastrointestinal Normal   Assessment/Plan:  Assessment/Plan:   Assessment The patient is a 59 year old with history of major depression, ongoing treatment in the Behavioral Medicine Unit, consulted for:   1.  Ongoing cough with symptoms of hemoptysis, suspected from right endobronchial mass seen on CT chest worrisome for malignancy.  The patient has long-standing history of smoking 3 packs per day since age 74 years.  He just finished a round of antibiotic with Levaquin for 5 to 7 days which was started 08/08/2012.  No hypoxia or shortness of breath at this time.  No respiratory distress.  appreciate Dr. Laurelyn Sickle input.  The patient will need bronchoscopy for biopsy and further evaluation of the mass if he allows.  No indications for antibiotics at this time.   2.  Chronic obstructive pulmonary disease/emphysema, as needed Combivent.  3.  Depression.  Management per Dr. Weber Cooks.  4.  Tobacco abuse.  The patient has not smoked since January 8th, since being here in the hospital.  The patient counseled for 3 mins, but doesn't appear to be motivated.    Plan time spent: 15 mins  Will sign off.  Please call with questions.   Electronic Signatures: Remer Macho (MD)  (Signed 27-Jan-14 17:28)  Authored: Chief Complaint, VITAL SIGNS/ANCILLARY NOTES, Brief Assessment, Assessment/Plan   Last Updated: 27-Jan-14 17:28 by Remer Macho (MD)

## 2014-11-21 NOTE — Consult Note (Signed)
Chief Complaint:   Subjective/Chief Complaint same, wants to go home   VITAL SIGNS/ANCILLARY NOTES: **Vital Signs.:   26-Jan-14 07:37   Vital Signs Type Routine   Temperature Temperature (F) 98   Celsius 36.6   Pulse Pulse 105   Respirations Respirations 20   Systolic BP Systolic BP 507   Diastolic BP (mmHg) Diastolic BP (mmHg) 64   Systolic BP Systolic BP 94   Diastolic BP (mmHg) Diastolic BP (mmHg) 64   Brief Assessment:   Cardiac Regular  no murmur    Respiratory normal resp effort    Gastrointestinal Normal   Assessment/Plan:  Assessment/Plan:   Assessment The patient is a 59 year old with history of major depression, ongoing treatment in the Behavioral Medicine Unit, consulted for:   1.  Ongoing cough with symptoms of hemoptysis, suspected from right endobronchial mass seen on CT chest worrisome for malignancy.  The patient has long-standing history of smoking 3 packs per day since age 71 years.  He just finished a round of antibiotic with Levaquin for 5 to 7 days which was started 08/08/2012.  No hypoxia or shortness of breath at this time.  No respiratory distress.  appreciate Dr. Laurelyn Sickle input.  The patient will need bronchoscopy for biopsy and further evaluation of the mass if he allows.  No indications for antibiotics at this time.   2.  Chronic obstructive pulmonary disease/emphysema, as needed Combivent.  3.  Depression.  Management per Dr. Weber Cooks.  4.  Tobacco abuse.  The patient has not smoked since January 8th, since being here in the hospital.  The patient counseled for 3 mins, but doesn't appear to be motivated.    Plan time spent: 20 mins   Electronic Signatures: Remer Macho (MD)  (Signed 26-Jan-14 14:27)  Authored: Chief Complaint, VITAL SIGNS/ANCILLARY NOTES, Brief Assessment, Assessment/Plan   Last Updated: 26-Jan-14 14:27 by Remer Macho (MD)

## 2014-11-21 NOTE — Discharge Summary (Signed)
PATIENT NAME:  [name] [name], [name] [name] MR#:  [id number] DATE OF BIRTH:  07-03-1956  DATE OF ADMISSION:  08/08/2012 DATE OF DISCHARGE:  09/01/2012  HOSPITAL COURSE: See dictated history and physical for details of admission. This 60 year old man was brought to the hospital under involuntary petition when his wife became increasingly concerned about his self-neglect. The patient was not eating healthfully and had become extremely weak and  inactive and was not seeming to be thinking very clearly. In the hospital, he displayed symptoms consistent with major depression including irritable and dysphoric affect, lack of motivation, lack of energy, poor sleep, a general feeling of hopelessness. He denied suicidal ideation, but was unable to articulate much in the way of a plan for discharge. The patient had poor insight throughout his hospital stay. Despite the obviousness of his disability, he constantly requested discharge home. We would point out to him that he had been spiraling downward into poor health at home and needed to get better, but he would argue quite angrily. The patient did eventually become more physically active, walking at times with a walker but increasingly without needing a walker. He ate more and gained some weight. He started to look more healthy. Stopped complaining as consistently about his sleep problems. Despite all this, he never did attend any therapy groups and was constantly dismissive of the idea of treatment. He continued to be argumentative with poor insight. He did comply with medications which were Remeron and Abilify for his depression. He is agreeable to continuing medications at the time of discharge. At time of discharge, the patient continues to adamantly request being released. His family appears to be somewhat ambivalent knowing that the patient is unhappy in the hospital. The patient is not talking about killing himself and does not appear to be showing an improved ability to take  care of himself. At this point, I think it is reasonable for him to move to outpatient treatment.   Medically he had pneumonia when he came into the hospital, which was treated with antibiotics orally. Later on he started to cough some more after a period of quiescence. A repeat scan of his chest showed a mass, which was described on the CT scan as being a 9.3 x 13 mL nodular mass in the right lower lobe. It was recommended that the patient have bronchoscopy and biopsy. He refused to comply with this while he was in the hospital. He does, however, say that once he goes home, he is willing to do it. He understands that there is a concern that this may be a cancer that might be treatable and is agreeable to getting the bronchoscopy.   LABORATORY RESULTS: Admission labs showed a chemistry with an elevated glucose at 141 that was probably postprandial, potassium low at 3.2, calcium low at 8.1, bilirubin elevated at 1.5. Alcohol, undetectable. TSH normal, free thyroxine normal, CK normal. Drug screen negative. CBC showed hematocrit low at 27. Followup labs showed a total iron binding capacity low at 149, iron low at 43, ferritin elevated at 435. Hematocrit remained stable. Urinalysis showed some blood in it. EKG, junctional ST depression and tachycardia. Head CT showed an extensive white matter density change and bilateral thalamic lacunar infarctions consistent with his history of small vessel disease as well as cerebral atrophy. A repeat set of chemistries done recently are more normal with calcium still slightly low at 8.4, potassium normal. Glucose has been normal. I already mentioned the chest CT scan.   DISCHARGE MEDICATIONS:  Mirtazapine  45 mg at night, Abilify 10 mg in the day, Combivent inhaler 1 puff 4 times a day, trazodone 50 mg at night.   MENTAL STATUS EXAM AT DISCHARGE: Casually dressed, not terribly well groomed man who looks his stated age or older. Passively cooperative. Eye contact good.  Psychomotor activity a little slow, but better than usual. Speech is decreased in total amount, but normal in tone. Affect remains easily irritated. Mood stated as being ready to leave. Thoughts are generally lucid. No sign of bizarre thinking or loosening of associations or delusions. Denies auditory or visual hallucinations. Denies suicidal or homicidal ideation. Short and long-term memory grossly intact. Alert and oriented x 4. Judgment and insight improved.   DISPOSITION: Discharge home with followup already scheduled for the [place] [facility] and [facility]   DIAGNOSIS PRINCIPAL AND PRIMARY: AXIS I:  Major depression, recurrent, severe.   SECONDARY DIAGNOSES: AXIS I: No further.  AXIS II: Deferred.  AXIS III: History of multiple strokes, pneumonia that has been treated, rule out lung cancer by upcoming biopsy of mass, chronic obstructive pulmonary disease.  AXIS IV: Severe from social isolation.  AXIS V: Functioning at time of discharge 53.     ____________________________ Gonzella Lex, MD jtc:cc D: 09/01/2012 13:25:15 ET T: 09/02/2012 16:47:49 ET JOB#: [id number]  cc: Gonzella Lex, MD, <Dictator> Gonzella Lex MD ELECTRONICALLY SIGNED 09/03/2012 18:47

## 2014-11-21 NOTE — Consult Note (Signed)
PATIENT NAME:  [name] [name], [name] [name] MR#:  [id number] DATE OF BIRTH:  Nov 13, 1955  DATE OF CONSULTATION:    REFERRING PHYSICIAN:  Dr. Weber Cooks. CONSULTING PHYSICIAN:  Stephana Morell A. Posey Pronto, MD  PRIMARY CARE PHYSICIAN:  Dr. Silvio Pate.  REASON FOR CONSULTATION:  Persistent cough.   HISTORY OF PRESENT ILLNESS:  The patient is a 59 year old Caucasian gentleman with a past medical history of depression and history of alcohol abuse, was admitted on January 8th on the Behavioral Medicine Unit for ongoing major depression.  The patient was treated.  Internal medicine consultation was done early part of January when patient was admitted and his chest x-ray did show right middle lobe pneumonia.  He had symptoms of cough, was treated for it with Levaquin for 5 to 7 days.  The patient did relatively better, however for past 3 to 4 days his symptoms of cough have started increasing and he did have episodes where he had cough with streaks of blood and one time had, according to the patient, "I passed blood clot."  He does not have any more hemoptysis.  Internal medicine was re-consulted for ongoing cough and possible hemoptysis.   SOCIAL HISTORY:  He is separated from his wife.  He used to run a lawn care business.  The patient has history of smoking 3 packs per day since age 66.  He also has history of alcohol abuse.   FAMILY HISTORY:  Positive for hypertension.   PAST MEDICAL HISTORY:  The patient states he has history of stroke.  He also has history of prostate cancer according to him and renal cancer which has been treated with radiation in the past.  The patient does not have any details of it and no records are available at this time.   ALLERGIES:  TO ERYTHROMYCIN.   CURRENT MEDICATIONS: 1.  Tylenol 650 q. 4 as needed.  2.  Maalox 30 mL q. 4 as needed.  3.  Milk of magnesia 30 mL by mouth at bedtime as needed.  4.  Nicotine oral inhaler kit.  5.  Remeron 45 mg at bedtime.  6.  Trazodone 50 mg at bedtime.  7.   Guaifenesin 10 mL q. 4 as needed for cough.  8.  Abilify 5 mg daily.   REVIEW OF SYSTEMS:  CONSTITUTIONAL:  No fever.  Positive for fatigue and weakness.  EYES:  No blurred or double vision.  No glaucoma.  EARS, NOSE, THROAT:  No tinnitus, ear pain, hearing loss.  RESPIRATORY:  Positive for cough and hemoptysis, which has resolved.  Positive for shortness of breath.  CARDIOVASCULAR:  No chest pain, orthopnea, edema or hypertension.  GASTROENTEROLOGIST:  No nausea, vomiting, diarrhea, abdominal pain or GERD.  GENITOURINARY:  No dysuria, hematuria.  ENDOCRINE:  No polyuria, nocturia or thyroid problems.  HEMATOLOGY:  No anemia or easy bruising.  SKIN:  No acne or rash.  MUSCULOSKELETAL:  No arthritis.  NEUROLOGIC:  No CVA, TIA.  PSYCHIATRIC:  Positive for depression.  All other systems reviewed and negative.   PHYSICAL EXAMINATION: GENERAL:  The patient is awake, alert, oriented x 3.  His general appearance is poorly dressed and disheveled.   VITAL SIGNS:  Afebrile, pulse is 81, blood pressure is 131/85.   HEENT:  Atraumatic, normocephalic.  Pupils PERRLA.  EOMI intact.  Oral mucosa is moist.  NECK:  Supple.  No JVD.  No carotid bruit.   RESPIRATORY:  Clear to auscultation bilaterally.  No rales, rhonchi, respiratory distress or labored breathing.  CARDIOVASCULAR:  Both the heart sounds are normal.  Rate, rhythm is regular.  PMI not lateralized.  Chest nontender.  EXTREMITIES:  Good pedal pulses, good femoral pulses.  No lower extremity edema.  The patient has extensive clubbing in both upper extremities.  NEUROLOGIC:  Grossly intact cranial nerves II-XII.  No motor or sensory deficits.  PSYCHIATRIC:  The patient is awake, alert, oriented x 3.   LABORATORY AND IMAGING DATA:  CT of the chest without contrast shows right bronchial obstruction which appears to extend into the right middle lobe and right lower lobe bronchi.  No radicular nodular appearance in the right middle lobe and lower  lobe, may be represent inflammatory changes or atelectatic changes.  Malignancy is a primary consideration.  Underlying emphysematous lung disease is present.  A palpable mass of right lower lobe measuring 1.3 x 0.93 cm.  There may be some right hilar lymphadenopathy, but is poorly evaluated on noncontrast exam.  Occult is negative.  Magnesium is 1.8.  H and H is 9.1 and 27.5, platelet count is 317.   ASSESSMENT AND PLAN:  The patient is a 59 year old with history of major depression, ongoing treatment in the Behavioral Medicine Unit, consulted for:  1.  Ongoing cough with symptoms of hemoptysis, suspected from right endobronchial mass seen on CT chest worrisome for malignancy.  The patient has long-standing history of smoking 3 packs per day since age 59 years.  He just finished a round of antibiotic with Levaquin for 5 to 7 days which was started 08/08/2012.  No hypoxia or shortness of breath at this time.  No respiratory distress.  We will consult pulmonary, Dr. Humphrey Rolls.  The patient will need bronchoscopy for biopsy and further evaluation of the mass.  No indications for antibiotics at this time.   2.  Chronic obstructive pulmonary disease/emphysema, as needed Combivent.  3.  Depression.  Management per Dr. Weber Cooks.  4.  Tobacco abuse.  The patient has not smoked since January 8th, since being here in the hospital.  The patient counseled, smoking cessation appears not motivated.  5.  Further work-up regarding patient's clinical course.  We will follow with you.  Dr. Weber Cooks has been paged to discuss above, have not heard from him yet.  We will discuss the case with pulmonology.   Thank you for the consult.   TIME SPENT:  55 minutes.       ____________________________ Hart Rochester Posey Pronto, MD sap:ea D: 08/24/2012 17:34:38 ET T: 08/24/2012 23:45:58 ET JOB#: [id number]  cc: Amire Gossen A. Posey Pronto, MD, <Dictator> Ilda Basset MD ELECTRONICALLY SIGNED 09/07/2012 7:07

## 2014-11-21 NOTE — H&P (Signed)
PATIENT NAME:  [name] [name], [name] [name] MR#:  [id number] DATE OF BIRTH:  January 05, 1956  DATE OF ADMISSION:  08/08/2012  IDENTIFYING INFORMATION:  A 59 year old man brought to the Emergency Room by his wife with involuntary commitment.   CHIEF COMPLAINT:  "I guess I don't know how bad it's getting."   HISTORY OF PRESENT ILLNESS:  Information obtained from the patient and the chart.  The patient has been separated from his wife, but she still comes around to visit him regularly.  She has been witnessing him becoming more and more debilitated with less activity and a worse diet as his depression has gotten worse.  She came by to see him and found him virtually unable to walk because of weakness with almost no food except for candy in the house.  The patient admits that his mood is bad most of the time and has been for an unspecified period of time, but at least months.  He admits that he sleeps very badly and chaotically.  His activities have dwindled down to little more than watching television at most.  He feels hopeless about his situation.  He admits that he has had some thoughts about wishing he would die, but denies that he has had any suicidal intent.  Denies psychotic symptoms.  He dates the beginning of his depression to a stroke he had a couple of years ago.  He apparently recovered quite a bit if not all of his function from his stroke, but ever since then, has not regained any of his actual activity.  He has been treated for depression by his primary care doctor, Dr. Silvio Pate, but the patient stopped the antidepressants because he did not feel they were working for him.  He tells [place] that he has not had any alcohol in the past two weeks.  Prior to that, he had been drinking up to 4 beers a day.  This is in contrast to the 12 beers or more a day that he used to drink prior to his stroke.  He denies that he is abusing any other drugs.   PAST PSYCHIATRIC HISTORY:  No previous psychiatric hospitalizations.  Denies  suicide attempts.  He has been prescribed antidepressants by his primary care doctor, probably Zoloft, possibly also Remeron or Prozac, but has not been compliant with the medications.  No history prior to that of any psychiatric illness.  Has a long history of heavy drinking, apparently had not been addressed as a problem in the past.   SOCIAL HISTORY:  He is separated from his wife.  Apparently, she left him because of the progression of his depression and his poor self-care.  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   FAMILY HISTORY:  No known family history of mental health problems.   PAST MEDICAL HISTORY:  The patient has a history of a stroke which at the time caused weakness in his right side, but from which he apparently has recovered function.  This seems to have happened a couple years ago.  He also has a history of prostate cancer and according to him also renal cancer which has been treated with radiation in the past.   MEDICATIONS:  He is currently noncompliant with any of his medication.  He remembers Plavix, but cannot remember any other specific medicines that he was supposed to be taking.   ALLERGIES:  ERYTHROMYCIN.   REVIEW OF SYSTEMS:  Complains of weakness, tiredness,  hopelessness, depressed mood.  Poor sleep.  Passive suicidal ideation.  Denies psychotic symptoms.  Denies panic attacks.  Appetite has been very poor.   MENTAL STATUS EXAMINATION: Somewhat disheveled man who is pleasantly interactive during the interview.  Eye contact is good.  Psychomotor activity is reasonably normal given that he is physically debilitated.  Speech is normal in rate, tone and volume.  Affect is somewhat riley, happy.  His mood is stated as being bad most of the time.  Thoughts tend towards the negative.  Some impoverished thinking.  No obvious delusional thinking.  Denies hallucinations.  Has passive suicidal thoughts with no intention or plan of  hurting himself.  No homicidal ideation.  Formal cognitive testing not done.  Seems to have some lapses in both short and long-term memory, but that may be related to decreased effort.  Baseline intelligence presumed to be normal.   PHYSICAL EXAMINATION: GENERAL:  Sick-appearing, but not emaciated gentleman who looks older than his stated age.  He has dirty and unkempt skin and hair, but no acute lesions identified.  HEENT:  Pupils equal and reactive.  Face appears to be symmetric.  NECK AND BACK:  Stiff, but nontender.  The patient is weak, but is able to move all extremities.  Strength in the upper extremities is normal symmetrically with normal reflexes.  Strength in both lower extremities is symmetric, but decreased and his legs appear more wasted than his upper body.  He does have a notably grey tinge to the skin in his lower body.  There is no noticeable difference in strength on either side of his body.  LUNGS:  Clear to my examination.  HEART:  Regular rate and rhythm.  ABDOMEN:  Nontender, normal bowel sounds.  VITAL SIGNS:  Temperature 98.1, pulse 86, respirations 17, blood pressure 112/69.   LABORATORY RESULTS:  Drug screen is negative.  CK is low at 22.  TSH is normal 3.73.  Alcohol undetectable.  Chemistry panel shows an elevated glucose at 141, low potassium 3.2, calcium slightly low at 8.1.  CBC shows a normal white count at 7.3, hematocrit low at 35.  Most notable abnormality is that his chest x-ray is read as having a right middle lobe pneumonia.  The head CT shows no acute ischemic or hemorrhagic evidence.  Extensive white matter changes, bilateral thalamic infarctions consistent with small vessel ischemia and diffuse atrophy.   ASSESSMENT:  The patient is a 59 year old man who presents with multiple symptoms of depression, debilitated activity, poor self-care, poor nutrition, negative thinking, passive suicidal ideation.  Has not been able to care for himself properly.  He is  appropriate for admission to the hospital.  It was only after he was actually admitted that it was brought to our attention that the chest x-ray showed a pneumonia, at which point we evaluated him and decided he could be treated for now on the psychiatry service.   TREATMENT PLAN:  Admit to psychiatry.  Begin antibiotics with Levaquin orally for the pneumonia.  Check vital signs and monitor oxygen saturation.  I have elected to start mirtazapine 15 mg initially at night for depression.  Get a medical consult.  Engage him in groups and activities.  Recheck labs.  Get collateral history from the wife.   DIAGNOSIS PRINCIPLE AND PRIMARY:  AXIS I:  Major depression, single, severe.   SECONDARY DIAGNOSES: AXIS I:  Alcohol dependence, in partial remission.  AXIS II:  Deferred.  AXIS III:  Pneumonia, history of stroke, diffuse  small vessel ischemia.  AXIS IV:  Severe from loss of family support and lack of resources.  AXIS V:  Functioning at time of evaluation 30.    ____________________________ Gonzella Lex, MD jtc:ea D: 08/09/2012 00:17:00 ET T: 08/09/2012 01:49:38 ET JOB#: [id number]  cc: Gonzella Lex, MD, <Dictator> Gonzella Lex MD ELECTRONICALLY SIGNED 08/09/2012 14:21

## 2014-11-21 NOTE — Consult Note (Signed)
PATIENT NAME:  [name] [name], [name] [name] MR#:  [id number] DATE OF BIRTH:  [birth year]  DATE OF CONSULTATION:  08/25/2012  REFERRING PHYSICIAN:   CONSULTING PHYSICIAN:  Allyne Gee, MD  REASON FOR CONSULTATION: Lung mass.   This is a 59 year old white male, who was admitted to the psychiatric service for apparent suicidal ideation. Since admitted, he was evaluated by the hospitalist service for cough and also he has been having some hemoptysis. The patient had apparently a chest x-ray done which shows  right middle lobe pneumonia and, as an  outpatient, had been treated by is primary care physician for the pneumonia. He now presents with coughing up some blood, which initially was significant and now he says it has improved.   PAST MEDICAL HISTORY: Significant for stroke and prostate cancer.   ALLERGIES: TO ERYTHROMYCIN.   REVIEW OF SYSTEMS: A 12-point review of systems was performed and was unremarkable other than what is noted and   pulmonary issues going on right now.  FAMILY HISTORY: Positive for hypertension.   PHYSICAL EXAMINATION:   VITAL SIGNS: At the time he was evaluated, temperature was 98, pulse of 83, respiratory rate 18, blood pressure normal .  NECK: Basically appears to be supple. There is no JVD. No lymphadenopathy. No thyromegaly.  CHEST: No rales or rhonchi. Expansion is equal.  CARDIOVASCULAR:  s1s2 normal. Regular rhythm. No gallop or rub.  ABDOMEN: Soft and nontender.  EXTREMITIES: without  cyanosis or clubbing. Pulses equal. SKIN:  without any acute rashes.  MUSCULOSKELETAL: no active synovitis.   LABORATORY DATA: He had a CT scan of his chest done, which shows an obstructing right-sided mass, likely representing carcinoma.   IMPRESSION: Right lung mass.   PLAN: I spoke to the patient in detail regarding the findings of his CT scan. He had told Dr.  Manuella Ghazi that he is not really interested in doing any kind of workup. I tried to convince him that it is important that we  pursue the diagnosis to try to figure out what he is suffering from. After consideration, he says that he will consider the workup, but he wants to be discharged from the psych floor first, and I explained to him that that is up to the psychiatrist who is taking care of him, but if he to stay here for an extended period of time, I would rather prefer that he get a bronchoscopy as an inpatient because I am not so sure how reliable he may be to come back for the bronchoscopy once he is discharged and again strongly suggested we do the procedure. However he was not wanting to do the procedure at this time.    ____________________________ Allyne Gee, MD sak:aw D: 08/25/2012 20:15:45 ET T: 08/26/2012 07:09:27 ET JOB#: [id number]  cc: Allyne Gee, MD, <Dictator> Allyne Gee MD ELECTRONICALLY SIGNED 09/21/2012 16:04

## 2014-11-21 NOTE — Consult Note (Signed)
Chief Complaint:   Subjective/Chief Complaint not wanting more w/up for his possible lung mass, just wants to get out of here   VITAL SIGNS/ANCILLARY NOTES: **Vital Signs.:   25-Jan-14 07:51   Vital Signs Type Routine   Temperature Temperature (F) 98   Celsius 36.6   Pulse Pulse 83   Respirations Respirations 18   Systolic BP Systolic BP 545   Diastolic BP (mmHg) Diastolic BP (mmHg) 68   Brief Assessment:   Cardiac Regular  no murmur    Respiratory normal resp effort    Gastrointestinal Normal   Radiology Results: XRay:    23-Jan-14 11:54, Chest PA and Lateral   Chest PA and Lateral    REASON FOR EXAM:    Recent pneumonia treated but now back to coughing up   blood  COMMENTS:       PROCEDURE: DXR - DXR CHEST PA (OR AP) AND LATERAL  - Aug 23 2012 11:54AM     RESULT: Comparison is made to the previous exam dated 08 August 2012.   There is persistent density in the right lung middle lobe region medial   segment consistent with pneumonia which has incompletely resolved. The   lungs are otherwise clear. The heart and pulmonary vessels are normal.   The bony structures are unremarkable.    IMPRESSION:  Persistent density in the right lung. Findings suggest   improvement especially on the lateral view. Resolving pneumonia is most   likely radiographically. A discrete masses not appreciated but close     clinical correlation, laboratory correlation and imaging followup to   document complete resolution is required.    Dictation Site: 2        Verified By: [name] [name], M.D., MD  CT:    24-Jan-14 15:56, CT Chest Without Contrast   CT Chest Without Contrast    REASON FOR EXAM:    persistent cough  COMMENTS:       PROCEDURE: CT  - CT CHEST WITHOUT CONTRAST  - Aug 24 2012  3:56PM     RESULT:     TECHNIQUE: Chest CT without contrast is reconstructed at 3 mm slice   thickness in the axial plane.     There is no previous exam for comparison.    FINDINGS: Lung  window images demonstrate a multitude of reticular   opacities predominantly in the right lower lobe and right middle lobe   radiating toward the infrahilar region on the right. There is a nodular     mass in the area of image 50 and 49 in the right lower lobe measuring 9.3   x 13 mm. There are underlying centri-lobular emphysematous changes. There   appears to be some subpleural fibrosis laterally in the right upper lobe   in the area of image 33 to 37. A well defined mass is not present in this   region. There is some minimal pleural thickening. Nonenlarged mediastinal   lymph nodes are present. There may be some hilar adenopathy in the area   of image 53 but this is poorly evaluated on a noncontrast scan. Minimal   pericardial thickening or fluid is present. No large pleural fluid   collections are seen. The noncontrast images of the upper abdomen show no   significant abnormality. The adrenal glands appear grossly normal.    The right mainstem bronchus appears to be occluded starting on image 47.   Bronchoscopy is recommended to assess for endobronchial mass. There   appears to  be possible airway obstruction extending into the right lower   lobe and right middle lobe.   IMPRESSION:    1.  Right bronchial obstruction which appears to extend into the right   middle lobe and right lower lobe bronchi. Pulmonology consultation with   consideration for bronchoscopy is recommended. The reticulonodular   appearance in the right middlelobe and right lower lobe may represent   inflammatory changes or atelectatic changes. Malignancy is a primary   consideration. Underlying emphysematous lung disease is present. There is   a possible mass in the right lower lobe measuring 1.3 x 0.93 cm on image   50. There may be some right hilar adenopathy but this is poorly evaluated   on this noncontrast exam.    Dictation Site: 1    Thank you for this opportunity to contribute to the care of your  patient.         Verified By: [name] [name], M.D., MD   Assessment/Plan:  Assessment/Plan:   Assessment The patient is a 59 year old with history of major depression, ongoing treatment in the Behavioral Medicine Unit, consulted for:   1.  Ongoing cough with symptoms of hemoptysis, suspected from right endobronchial mass seen on CT chest worrisome for malignancy.  The patient has long-standing history of smoking 3 packs per day since age 59 years.  He just finished a round of antibiotic with Levaquin for 5 to 7 days which was started 08/08/2012.  No hypoxia or shortness of breath at this time.  No respiratory distress.  await pulmonary c/s with Dr. Humphrey Rolls.  The patient will need bronchoscopy for biopsy and further evaluation of the mass if he allows.  No indications for antibiotics at this time.   2.  Chronic obstructive pulmonary disease/emphysema, as needed Combivent.  3.  Depression.  Management per Dr. Weber Cooks.  4.  Tobacco abuse.  The patient has not smoked since January 8th, since being here in the hospital.  The patient counseled for 3 mins, but doesn't appear to be motivated.    Plan time spent: 20 mins   Electronic Signatures: [name] [name] (MD)  (Signed 25-Jan-14 14:50)  Authored: Chief Complaint, VITAL SIGNS/ANCILLARY NOTES, Brief Assessment, Radiology Results, Assessment/Plan   Last Updated: 25-Jan-14 14:50 by [name] [name] (MD)
